# Patient Record
Sex: Male | Born: 1950 | Race: White | Hispanic: No | Smoking: Current every day smoker
Health system: Southern US, Community
[De-identification: ages and names within clinical notes are randomized; demographics above are authoritative.]

## PROBLEM LIST (undated history)

## (undated) DIAGNOSIS — D751 Secondary polycythemia: Principal | ICD-10-CM

## (undated) DIAGNOSIS — D45 Polycythemia vera: Secondary | ICD-10-CM

## (undated) DIAGNOSIS — IMO0002 Reserved for concepts with insufficient information to code with codable children: Secondary | ICD-10-CM

## (undated) DIAGNOSIS — I10 Essential (primary) hypertension: Secondary | ICD-10-CM

## (undated) HISTORY — PX: VASECTOMY: SHX75

## (undated) HISTORY — PX: TONSILLECTOMY: SHX5217

## (undated) HISTORY — DX: Polycythemia vera: D45

## (undated) HISTORY — PX: HERNIA REPAIR: SHX51

## (undated) HISTORY — DX: Secondary polycythemia: D75.1

## (undated) HISTORY — DX: Essential (primary) hypertension: I10

## (undated) HISTORY — DX: Reserved for concepts with insufficient information to code with codable children: IMO0002

---

## 2007-03-02 ENCOUNTER — Encounter (HOSPITAL_COMMUNITY): Admission: RE | Admit: 2007-03-02 | Discharge: 2007-05-20 | Payer: Self-pay

## 2008-01-04 ENCOUNTER — Encounter (HOSPITAL_COMMUNITY): Admission: RE | Admit: 2008-01-04 | Discharge: 2008-04-03 | Payer: Self-pay | Admitting: Internal Medicine

## 2008-11-23 ENCOUNTER — Encounter: Admission: RE | Admit: 2008-11-23 | Discharge: 2008-11-23 | Payer: Self-pay | Admitting: Family Medicine

## 2008-11-25 ENCOUNTER — Ambulatory Visit: Payer: Self-pay | Admitting: Internal Medicine

## 2008-11-30 ENCOUNTER — Ambulatory Visit: Admission: RE | Admit: 2008-11-30 | Discharge: 2008-11-30 | Payer: Self-pay | Admitting: Internal Medicine

## 2008-11-30 LAB — CBC WITH DIFFERENTIAL/PLATELET
BASO%: 0.3 % (ref 0.0–2.0)
Basophils Absolute: 0 10*3/uL (ref 0.0–0.1)
HCT: 57.8 % — ABNORMAL HIGH (ref 38.4–49.9)
HGB: 19 g/dL — ABNORMAL HIGH (ref 13.0–17.1)
MONO#: 0.5 10*3/uL (ref 0.1–0.9)
NEUT#: 6.6 10*3/uL — ABNORMAL HIGH (ref 1.5–6.5)
NEUT%: 71.2 % (ref 39.0–75.0)
WBC: 9.3 10*3/uL (ref 4.0–10.3)
lymph#: 1.9 10*3/uL (ref 0.9–3.3)

## 2008-12-01 LAB — FERRITIN: Ferritin: 6 ng/mL — ABNORMAL LOW (ref 22–322)

## 2008-12-01 LAB — IRON AND TIBC
%SAT: 13 % — ABNORMAL LOW (ref 20–55)
TIBC: 459 ug/dL — ABNORMAL HIGH (ref 215–435)

## 2008-12-01 LAB — COMPREHENSIVE METABOLIC PANEL
ALT: 20 U/L (ref 0–53)
Albumin: 4.1 g/dL (ref 3.5–5.2)
BUN: 14 mg/dL (ref 6–23)
CO2: 27 mEq/L (ref 19–32)
Calcium: 9.1 mg/dL (ref 8.4–10.5)
Chloride: 106 mEq/L (ref 96–112)
Creatinine, Ser: 0.98 mg/dL (ref 0.40–1.50)
Potassium: 4.7 mEq/L (ref 3.5–5.3)

## 2008-12-07 LAB — CBC WITH DIFFERENTIAL/PLATELET
Basophils Absolute: 0 10*3/uL (ref 0.0–0.1)
EOS%: 3.8 % (ref 0.0–7.0)
Eosinophils Absolute: 0.3 10*3/uL (ref 0.0–0.5)
HCT: 54.5 % — ABNORMAL HIGH (ref 38.4–49.9)
HGB: 18.1 g/dL — ABNORMAL HIGH (ref 13.0–17.1)
MONO#: 0.3 10*3/uL (ref 0.1–0.9)
NEUT#: 4 10*3/uL (ref 1.5–6.5)
NEUT%: 60.9 % (ref 39.0–75.0)
RDW: 17.3 % — ABNORMAL HIGH (ref 11.0–14.6)
WBC: 6.6 10*3/uL (ref 4.0–10.3)
lymph#: 2 10*3/uL (ref 0.9–3.3)

## 2008-12-14 LAB — CBC WITH DIFFERENTIAL/PLATELET
BASO%: 0.7 % (ref 0.0–2.0)
Basophils Absolute: 0.1 10*3/uL (ref 0.0–0.1)
EOS%: 2.8 % (ref 0.0–7.0)
HGB: 17.2 g/dL — ABNORMAL HIGH (ref 13.0–17.1)
MONO%: 5.3 % (ref 0.0–14.0)
NEUT#: 5.4 10*3/uL (ref 1.5–6.5)
NEUT%: 62.8 % (ref 39.0–75.0)
Platelets: 185 10*3/uL (ref 140–400)
RBC: 6.09 10*6/uL — ABNORMAL HIGH (ref 4.20–5.82)
WBC: 8.6 10*3/uL (ref 4.0–10.3)
lymph#: 2.4 10*3/uL (ref 0.9–3.3)

## 2008-12-28 LAB — CBC WITH DIFFERENTIAL/PLATELET
BASO%: 0.5 % (ref 0.0–2.0)
Basophils Absolute: 0 10*3/uL (ref 0.0–0.1)
EOS%: 1.6 % (ref 0.0–7.0)
HGB: 16.4 g/dL (ref 13.0–17.1)
MCH: 28.5 pg (ref 27.2–33.4)
RDW: 16 % — ABNORMAL HIGH (ref 11.0–14.6)
lymph#: 1.8 10*3/uL (ref 0.9–3.3)

## 2009-01-09 ENCOUNTER — Ambulatory Visit: Payer: Self-pay | Admitting: Internal Medicine

## 2009-01-11 LAB — LACTATE DEHYDROGENASE: LDH: 120 U/L (ref 94–250)

## 2009-01-11 LAB — CBC WITH DIFFERENTIAL/PLATELET
Eosinophils Absolute: 0.2 10*3/uL (ref 0.0–0.5)
MONO#: 0.5 10*3/uL (ref 0.1–0.9)
NEUT#: 5 10*3/uL (ref 1.5–6.5)
Platelets: 189 10*3/uL (ref 140–400)
RBC: 6.02 10*6/uL — ABNORMAL HIGH (ref 4.20–5.82)
RDW: 15.4 % — ABNORMAL HIGH (ref 11.0–14.6)
WBC: 7.9 10*3/uL (ref 4.0–10.3)
lymph#: 2.2 10*3/uL (ref 0.9–3.3)

## 2009-02-06 LAB — CBC WITH DIFFERENTIAL/PLATELET
Basophils Absolute: 0.1 10*3/uL (ref 0.0–0.1)
EOS%: 2.4 % (ref 0.0–7.0)
Eosinophils Absolute: 0.2 10*3/uL (ref 0.0–0.5)
HGB: 15.3 g/dL (ref 13.0–17.1)
MCH: 26.2 pg — ABNORMAL LOW (ref 27.2–33.4)
MONO#: 0.6 10*3/uL (ref 0.1–0.9)
NEUT#: 4.5 10*3/uL (ref 1.5–6.5)
RDW: 14.4 % (ref 11.0–14.6)
WBC: 7.2 10*3/uL (ref 4.0–10.3)
lymph#: 1.9 10*3/uL (ref 0.9–3.3)

## 2009-03-02 ENCOUNTER — Ambulatory Visit: Payer: Self-pay | Admitting: Internal Medicine

## 2009-03-06 LAB — CBC WITH DIFFERENTIAL/PLATELET
BASO%: 0.9 % (ref 0.0–2.0)
EOS%: 5.1 % (ref 0.0–7.0)
MCH: 25.7 pg — ABNORMAL LOW (ref 27.2–33.4)
MCHC: 32.1 g/dL (ref 32.0–36.0)
MONO#: 0.4 10*3/uL (ref 0.1–0.9)
RBC: 6.01 10*6/uL — ABNORMAL HIGH (ref 4.20–5.82)
RDW: 15.3 % — ABNORMAL HIGH (ref 11.0–14.6)
WBC: 6.3 10*3/uL (ref 4.0–10.3)
lymph#: 1.7 10*3/uL (ref 0.9–3.3)

## 2009-03-30 ENCOUNTER — Ambulatory Visit: Payer: Self-pay | Admitting: Internal Medicine

## 2009-04-03 LAB — CBC WITH DIFFERENTIAL/PLATELET
BASO%: 1.2 % (ref 0.0–2.0)
EOS%: 3.9 % (ref 0.0–7.0)
HCT: 48.4 % (ref 38.4–49.9)
LYMPH%: 28.7 % (ref 14.0–49.0)
MCH: 25.5 pg — ABNORMAL LOW (ref 27.2–33.4)
MCHC: 32.2 g/dL (ref 32.0–36.0)
NEUT%: 58.7 % (ref 39.0–75.0)
Platelets: 172 10*3/uL (ref 140–400)
RBC: 6.12 10*6/uL — ABNORMAL HIGH (ref 4.20–5.82)
WBC: 6.3 10*3/uL (ref 4.0–10.3)

## 2009-04-26 ENCOUNTER — Ambulatory Visit: Payer: Self-pay | Admitting: Internal Medicine

## 2009-05-01 LAB — CBC WITH DIFFERENTIAL/PLATELET
BASO%: 0.8 % (ref 0.0–2.0)
HCT: 50.4 % — ABNORMAL HIGH (ref 38.4–49.9)
LYMPH%: 30.9 % (ref 14.0–49.0)
MCHC: 32.7 g/dL (ref 32.0–36.0)
MCV: 77.7 fL — ABNORMAL LOW (ref 79.3–98.0)
MONO#: 0.5 10*3/uL (ref 0.1–0.9)
MONO%: 6.3 % (ref 0.0–14.0)
NEUT%: 59.1 % (ref 39.0–75.0)
Platelets: 169 10*3/uL (ref 140–400)
RBC: 6.48 10*6/uL — ABNORMAL HIGH (ref 4.20–5.82)
WBC: 7.2 10*3/uL (ref 4.0–10.3)

## 2009-05-30 ENCOUNTER — Ambulatory Visit: Payer: Self-pay | Admitting: Internal Medicine

## 2009-06-01 LAB — CBC WITH DIFFERENTIAL/PLATELET
Basophils Absolute: 0 10*3/uL (ref 0.0–0.1)
Eosinophils Absolute: 0.2 10*3/uL (ref 0.0–0.5)
HCT: 53.4 % — ABNORMAL HIGH (ref 38.4–49.9)
HGB: 17.1 g/dL (ref 13.0–17.1)
LYMPH%: 25.8 % (ref 14.0–49.0)
MCV: 80.1 fL (ref 79.3–98.0)
MONO#: 0.5 10*3/uL (ref 0.1–0.9)
MONO%: 7.4 % (ref 0.0–14.0)
NEUT#: 4.7 10*3/uL (ref 1.5–6.5)
NEUT%: 64 % (ref 39.0–75.0)
Platelets: 196 10*3/uL (ref 140–400)
RBC: 6.67 10*6/uL — ABNORMAL HIGH (ref 4.20–5.82)
WBC: 7.4 10*3/uL (ref 4.0–10.3)

## 2009-06-21 LAB — CBC WITH DIFFERENTIAL/PLATELET
Basophils Absolute: 0.1 10*3/uL (ref 0.0–0.1)
EOS%: 3.3 % (ref 0.0–7.0)
HGB: 16.4 g/dL (ref 13.0–17.1)
MCH: 26.1 pg — ABNORMAL LOW (ref 27.2–33.4)
MONO%: 5.3 % (ref 0.0–14.0)
NEUT#: 5.3 10*3/uL (ref 1.5–6.5)
RBC: 6.26 10*6/uL — ABNORMAL HIGH (ref 4.20–5.82)
RDW: 18.5 % — ABNORMAL HIGH (ref 11.0–14.6)
lymph#: 2 10*3/uL (ref 0.9–3.3)

## 2009-06-27 ENCOUNTER — Ambulatory Visit: Payer: Self-pay | Admitting: Internal Medicine

## 2009-07-05 LAB — CBC WITH DIFFERENTIAL/PLATELET
Basophils Absolute: 0.1 10*3/uL (ref 0.0–0.1)
Eosinophils Absolute: 0.4 10*3/uL (ref 0.0–0.5)
HGB: 16.8 g/dL (ref 13.0–17.1)
MCV: 80.2 fL (ref 79.3–98.0)
MONO#: 0.6 10*3/uL (ref 0.1–0.9)
MONO%: 7.7 % (ref 0.0–14.0)
NEUT#: 4.9 10*3/uL (ref 1.5–6.5)
RBC: 6.45 10*6/uL — ABNORMAL HIGH (ref 4.20–5.82)
RDW: 17.1 % — ABNORMAL HIGH (ref 11.0–14.6)
WBC: 8.1 10*3/uL (ref 4.0–10.3)
lymph#: 2.1 10*3/uL (ref 0.9–3.3)

## 2009-07-31 ENCOUNTER — Ambulatory Visit: Payer: Self-pay | Admitting: Internal Medicine

## 2009-08-07 LAB — CBC WITH DIFFERENTIAL/PLATELET
BASO%: 0.5 % (ref 0.0–2.0)
Basophils Absolute: 0.1 10*3/uL (ref 0.0–0.1)
EOS%: 4.1 % (ref 0.0–7.0)
Eosinophils Absolute: 0.4 10*3/uL (ref 0.0–0.5)
HCT: 52 % — ABNORMAL HIGH (ref 38.4–49.9)
HGB: 16.9 g/dL (ref 13.0–17.1)
LYMPH%: 23.7 % (ref 14.0–49.0)
MCH: 26 pg — ABNORMAL LOW (ref 27.2–33.4)
MCHC: 32.5 g/dL (ref 32.0–36.0)
MCV: 80 fL (ref 79.3–98.0)
MONO#: 0.5 10*3/uL (ref 0.1–0.9)
MONO%: 4.9 % (ref 0.0–14.0)
NEUT#: 6.3 10*3/uL (ref 1.5–6.5)
NEUT%: 66.8 % (ref 39.0–75.0)
Platelets: 183 10*3/uL (ref 140–400)
RBC: 6.5 10*6/uL — ABNORMAL HIGH (ref 4.20–5.82)
RDW: 15.8 % — ABNORMAL HIGH (ref 11.0–14.6)
WBC: 9.4 10*3/uL (ref 4.0–10.3)
lymph#: 2.2 10*3/uL (ref 0.9–3.3)

## 2009-08-31 ENCOUNTER — Ambulatory Visit: Payer: Self-pay | Admitting: Internal Medicine

## 2009-09-04 LAB — CBC WITH DIFFERENTIAL/PLATELET
BASO%: 0.6 % (ref 0.0–2.0)
Basophils Absolute: 0.1 10*3/uL (ref 0.0–0.1)
EOS%: 3.3 % (ref 0.0–7.0)
Eosinophils Absolute: 0.3 10*3/uL (ref 0.0–0.5)
HCT: 50.8 % — ABNORMAL HIGH (ref 38.4–49.9)
HGB: 16.6 g/dL (ref 13.0–17.1)
LYMPH%: 24.9 % (ref 14.0–49.0)
MCH: 25.5 pg — ABNORMAL LOW (ref 27.2–33.4)
MCHC: 32.6 g/dL (ref 32.0–36.0)
MCV: 78 fL — ABNORMAL LOW (ref 79.3–98.0)
MONO#: 0.5 10*3/uL (ref 0.1–0.9)
MONO%: 5.2 % (ref 0.0–14.0)
NEUT#: 6.3 10*3/uL (ref 1.5–6.5)
NEUT%: 66 % (ref 39.0–75.0)
Platelets: 191 10*3/uL (ref 140–400)
RBC: 6.52 10*6/uL — ABNORMAL HIGH (ref 4.20–5.82)
RDW: 15.5 % — ABNORMAL HIGH (ref 11.0–14.6)
WBC: 9.6 10*3/uL (ref 4.0–10.3)
lymph#: 2.4 10*3/uL (ref 0.9–3.3)

## 2009-09-25 ENCOUNTER — Ambulatory Visit: Payer: Self-pay | Admitting: Internal Medicine

## 2009-11-03 ENCOUNTER — Ambulatory Visit: Payer: Self-pay | Admitting: Internal Medicine

## 2009-11-06 LAB — CBC WITH DIFFERENTIAL/PLATELET
BASO%: 1.2 % (ref 0.0–2.0)
Basophils Absolute: 0.1 10*3/uL (ref 0.0–0.1)
EOS%: 5.2 % (ref 0.0–7.0)
Eosinophils Absolute: 0.3 10*3/uL (ref 0.0–0.5)
HCT: 46 % (ref 38.4–49.9)
HGB: 14.8 g/dL (ref 13.0–17.1)
LYMPH%: 29.8 % (ref 14.0–49.0)
MCH: 24.5 pg — ABNORMAL LOW (ref 27.2–33.4)
MCHC: 32.1 g/dL (ref 32.0–36.0)
MCV: 76.2 fL — ABNORMAL LOW (ref 79.3–98.0)
MONO#: 0.3 10*3/uL (ref 0.1–0.9)
MONO%: 5.1 % (ref 0.0–14.0)
NEUT#: 3.8 10*3/uL (ref 1.5–6.5)
NEUT%: 58.7 % (ref 39.0–75.0)
Platelets: 172 10*3/uL (ref 140–400)
RBC: 6.04 10*6/uL — ABNORMAL HIGH (ref 4.20–5.82)
RDW: 18.1 % — ABNORMAL HIGH (ref 11.0–14.6)
WBC: 6.5 10*3/uL (ref 4.0–10.3)
lymph#: 1.9 10*3/uL (ref 0.9–3.3)

## 2009-11-06 LAB — LACTATE DEHYDROGENASE: LDH: 115 U/L (ref 94–250)

## 2009-11-12 ENCOUNTER — Emergency Department (HOSPITAL_BASED_OUTPATIENT_CLINIC_OR_DEPARTMENT_OTHER): Admission: EM | Admit: 2009-11-12 | Discharge: 2009-11-12 | Payer: Self-pay | Admitting: Emergency Medicine

## 2010-07-23 ENCOUNTER — Ambulatory Visit: Payer: Self-pay | Admitting: Internal Medicine

## 2010-07-23 LAB — CBC WITH DIFFERENTIAL/PLATELET
BASO%: 0.6 % (ref 0.0–2.0)
Basophils Absolute: 0 10*3/uL (ref 0.0–0.1)
EOS%: 4.4 % (ref 0.0–7.0)
Eosinophils Absolute: 0.3 10*3/uL (ref 0.0–0.5)
HCT: 54.5 % — ABNORMAL HIGH (ref 38.4–49.9)
HGB: 17.4 g/dL — ABNORMAL HIGH (ref 13.0–17.1)
LYMPH%: 21.1 % (ref 14.0–49.0)
MCH: 23 pg — ABNORMAL LOW (ref 27.2–33.4)
MCHC: 31.9 g/dL — ABNORMAL LOW (ref 32.0–36.0)
MCV: 72.3 fL — ABNORMAL LOW (ref 79.3–98.0)
MONO#: 0.5 10*3/uL (ref 0.1–0.9)
MONO%: 7.8 % (ref 0.0–14.0)
NEUT#: 4.4 10*3/uL (ref 1.5–6.5)
NEUT%: 66.1 % (ref 39.0–75.0)
Platelets: 142 10*3/uL (ref 140–400)
RBC: 7.55 10*6/uL — ABNORMAL HIGH (ref 4.20–5.82)
RDW: 22 % — ABNORMAL HIGH (ref 11.0–14.6)
WBC: 6.7 10*3/uL (ref 4.0–10.3)
lymph#: 1.4 10*3/uL (ref 0.9–3.3)

## 2010-07-23 LAB — LACTATE DEHYDROGENASE: LDH: 129 U/L (ref 94–250)

## 2010-10-23 LAB — BLOOD GAS, ARTERIAL
Bicarbonate: 24.8 mEq/L — ABNORMAL HIGH (ref 20.0–24.0)
FIO2: 0.21 %
O2 Saturation: 97.3 %
Patient temperature: 98.6

## 2010-11-01 ENCOUNTER — Encounter (HOSPITAL_BASED_OUTPATIENT_CLINIC_OR_DEPARTMENT_OTHER): Payer: 59 | Admitting: Internal Medicine

## 2010-11-01 ENCOUNTER — Other Ambulatory Visit: Payer: Self-pay | Admitting: Internal Medicine

## 2010-11-01 DIAGNOSIS — D45 Polycythemia vera: Secondary | ICD-10-CM

## 2010-11-01 LAB — CBC WITH DIFFERENTIAL/PLATELET
BASO%: 0.5 % (ref 0.0–2.0)
EOS%: 2.7 % (ref 0.0–7.0)
HCT: 55 % — ABNORMAL HIGH (ref 38.4–49.9)
LYMPH%: 22.6 % (ref 14.0–49.0)
MCH: 28.4 pg (ref 27.2–33.4)
MCHC: 33.3 g/dL (ref 32.0–36.0)
NEUT%: 69 % (ref 39.0–75.0)
Platelets: 127 10*3/uL — ABNORMAL LOW (ref 140–400)
RBC: 6.45 10*6/uL — ABNORMAL HIGH (ref 4.20–5.82)
lymph#: 2 10*3/uL (ref 0.9–3.3)

## 2010-11-26 ENCOUNTER — Other Ambulatory Visit: Payer: Self-pay | Admitting: Internal Medicine

## 2010-11-26 ENCOUNTER — Encounter (HOSPITAL_BASED_OUTPATIENT_CLINIC_OR_DEPARTMENT_OTHER): Payer: 59 | Admitting: Internal Medicine

## 2010-11-26 DIAGNOSIS — D751 Secondary polycythemia: Secondary | ICD-10-CM

## 2010-11-26 DIAGNOSIS — D45 Polycythemia vera: Secondary | ICD-10-CM

## 2010-11-26 LAB — CBC WITH DIFFERENTIAL/PLATELET
BASO%: 0.5 % (ref 0.0–2.0)
EOS%: 2.7 % (ref 0.0–7.0)
MCH: 29.2 pg (ref 27.2–33.4)
MCV: 86.7 fL (ref 79.3–98.0)
MONO%: 5.9 % (ref 0.0–14.0)
RBC: 6.07 10*6/uL — ABNORMAL HIGH (ref 4.20–5.82)
RDW: 16.7 % — ABNORMAL HIGH (ref 11.0–14.6)

## 2010-11-26 LAB — LACTATE DEHYDROGENASE: LDH: 137 U/L (ref 94–250)

## 2011-01-14 ENCOUNTER — Emergency Department (HOSPITAL_BASED_OUTPATIENT_CLINIC_OR_DEPARTMENT_OTHER)
Admission: EM | Admit: 2011-01-14 | Discharge: 2011-01-14 | Discharge status: Against Medical Advice | Payer: 59 | Attending: Emergency Medicine | Admitting: Emergency Medicine

## 2011-01-14 ENCOUNTER — Emergency Department (INDEPENDENT_AMBULATORY_CARE_PROVIDER_SITE_OTHER): Payer: 59

## 2011-01-14 DIAGNOSIS — J3489 Other specified disorders of nose and nasal sinuses: Secondary | ICD-10-CM

## 2011-01-14 DIAGNOSIS — G8929 Other chronic pain: Secondary | ICD-10-CM | POA: Insufficient documentation

## 2011-01-14 DIAGNOSIS — R209 Unspecified disturbances of skin sensation: Secondary | ICD-10-CM

## 2011-01-14 DIAGNOSIS — J984 Other disorders of lung: Secondary | ICD-10-CM | POA: Insufficient documentation

## 2011-01-14 DIAGNOSIS — M79609 Pain in unspecified limb: Secondary | ICD-10-CM

## 2011-01-14 DIAGNOSIS — I10 Essential (primary) hypertension: Secondary | ICD-10-CM | POA: Insufficient documentation

## 2011-01-14 DIAGNOSIS — G459 Transient cerebral ischemic attack, unspecified: Secondary | ICD-10-CM | POA: Insufficient documentation

## 2011-01-14 LAB — HEPATIC FUNCTION PANEL
ALT: 24 U/L (ref 0–53)
AST: 20 U/L (ref 0–37)
Albumin: 3.7 g/dL (ref 3.5–5.2)
Alkaline Phosphatase: 56 U/L (ref 39–117)
Total Bilirubin: 0.3 mg/dL (ref 0.3–1.2)

## 2011-01-14 LAB — CK TOTAL AND CKMB (NOT AT ARMC)
CK, MB: 1.7 ng/mL (ref 0.3–4.0)
Total CK: 60 U/L (ref 7–232)

## 2011-01-14 LAB — CBC
MCH: 28.1 pg (ref 26.0–34.0)
Platelets: 164 10*3/uL (ref 150–400)
RBC: 6.44 MIL/uL — ABNORMAL HIGH (ref 4.22–5.81)
WBC: 7.8 10*3/uL (ref 4.0–10.5)

## 2011-01-14 LAB — DIFFERENTIAL
Basophils Relative: 1 % (ref 0–1)
Eosinophils Absolute: 0.2 10*3/uL (ref 0.0–0.7)
Monocytes Relative: 6 % (ref 3–12)
Neutrophils Relative %: 67 % (ref 43–77)

## 2011-01-14 LAB — BASIC METABOLIC PANEL
CO2: 24 mEq/L (ref 19–32)
Calcium: 9.6 mg/dL (ref 8.4–10.5)
Sodium: 141 mEq/L (ref 135–145)

## 2011-01-14 LAB — PROTIME-INR: INR: 1.18 (ref 0.00–1.49)

## 2011-01-18 ENCOUNTER — Other Ambulatory Visit: Payer: Self-pay | Admitting: Internal Medicine

## 2011-01-18 ENCOUNTER — Encounter (HOSPITAL_BASED_OUTPATIENT_CLINIC_OR_DEPARTMENT_OTHER): Payer: 59 | Admitting: Internal Medicine

## 2011-01-18 DIAGNOSIS — D45 Polycythemia vera: Secondary | ICD-10-CM

## 2011-01-18 LAB — CBC WITH DIFFERENTIAL/PLATELET
BASO%: 0.2 % (ref 0.0–2.0)
EOS%: 1.8 % (ref 0.0–7.0)
LYMPH%: 19.1 % (ref 14.0–49.0)
MCH: 28.4 pg (ref 27.2–33.4)
MCHC: 32.9 g/dL (ref 32.0–36.0)
MCV: 86.2 fL (ref 79.3–98.0)
MONO%: 5.8 % (ref 0.0–14.0)
Platelets: 157 10*3/uL (ref 140–400)
RBC: 6.23 10*6/uL — ABNORMAL HIGH (ref 4.20–5.82)
WBC: 8.4 10*3/uL (ref 4.0–10.3)

## 2011-02-25 ENCOUNTER — Other Ambulatory Visit: Payer: Self-pay | Admitting: Internal Medicine

## 2011-02-25 ENCOUNTER — Encounter (HOSPITAL_BASED_OUTPATIENT_CLINIC_OR_DEPARTMENT_OTHER): Payer: 59 | Admitting: Internal Medicine

## 2011-02-25 DIAGNOSIS — J449 Chronic obstructive pulmonary disease, unspecified: Secondary | ICD-10-CM

## 2011-02-25 DIAGNOSIS — F17200 Nicotine dependence, unspecified, uncomplicated: Secondary | ICD-10-CM

## 2011-02-25 DIAGNOSIS — D751 Secondary polycythemia: Secondary | ICD-10-CM

## 2011-02-25 LAB — CBC WITH DIFFERENTIAL/PLATELET
BASO%: 0.9 % (ref 0.0–2.0)
EOS%: 3.4 % (ref 0.0–7.0)
LYMPH%: 29.4 % (ref 14.0–49.0)
MCH: 29 pg (ref 27.2–33.4)
MCHC: 33.7 g/dL (ref 32.0–36.0)
MONO#: 0.5 10*3/uL (ref 0.1–0.9)
RBC: 6.02 10*6/uL — ABNORMAL HIGH (ref 4.20–5.82)
WBC: 6.8 10*3/uL (ref 4.0–10.3)
lymph#: 2 10*3/uL (ref 0.9–3.3)

## 2011-02-25 LAB — LACTATE DEHYDROGENASE: LDH: 125 U/L (ref 94–250)

## 2011-03-25 ENCOUNTER — Other Ambulatory Visit: Payer: Self-pay | Admitting: Internal Medicine

## 2011-03-25 ENCOUNTER — Encounter (HOSPITAL_BASED_OUTPATIENT_CLINIC_OR_DEPARTMENT_OTHER): Payer: 59 | Admitting: Internal Medicine

## 2011-03-25 DIAGNOSIS — D45 Polycythemia vera: Secondary | ICD-10-CM

## 2011-03-25 DIAGNOSIS — D751 Secondary polycythemia: Secondary | ICD-10-CM

## 2011-03-25 LAB — CBC WITH DIFFERENTIAL/PLATELET
BASO%: 0.4 % (ref 0.0–2.0)
Eosinophils Absolute: 0.4 10*3/uL (ref 0.0–0.5)
HCT: 52.5 % — ABNORMAL HIGH (ref 38.4–49.9)
LYMPH%: 25.5 % (ref 14.0–49.0)
MCHC: 33.3 g/dL (ref 32.0–36.0)
MONO#: 0.5 10*3/uL (ref 0.1–0.9)
NEUT#: 4.6 10*3/uL (ref 1.5–6.5)
NEUT%: 61.6 % (ref 39.0–75.0)
Platelets: 167 10*3/uL (ref 140–400)
RBC: 6.14 10*6/uL — ABNORMAL HIGH (ref 4.20–5.82)
WBC: 7.5 10*3/uL (ref 4.0–10.3)
lymph#: 1.9 10*3/uL (ref 0.9–3.3)

## 2011-04-11 LAB — CBC
HCT: 47.6
Hemoglobin: 15.7
MCV: 73 — ABNORMAL LOW
Platelets: 213
WBC: 8.5

## 2011-04-12 LAB — CBC
HCT: 46.3
Hemoglobin: 14.9
MCHC: 32.3
Platelets: 209
RDW: 18.8 — ABNORMAL HIGH

## 2011-04-22 ENCOUNTER — Encounter (HOSPITAL_BASED_OUTPATIENT_CLINIC_OR_DEPARTMENT_OTHER): Payer: 59 | Admitting: Internal Medicine

## 2011-04-22 ENCOUNTER — Other Ambulatory Visit: Payer: Self-pay | Admitting: Internal Medicine

## 2011-04-22 DIAGNOSIS — D751 Secondary polycythemia: Secondary | ICD-10-CM

## 2011-04-22 LAB — CBC WITH DIFFERENTIAL/PLATELET
BASO%: 0.8 % (ref 0.0–2.0)
EOS%: 3.3 % (ref 0.0–7.0)
LYMPH%: 29.7 % (ref 14.0–49.0)
MCH: 28 pg (ref 27.2–33.4)
MCHC: 33.3 g/dL (ref 32.0–36.0)
MCV: 84.2 fL (ref 79.3–98.0)
MONO%: 8 % (ref 0.0–14.0)
Platelets: 147 10*3/uL (ref 140–400)
RBC: 6.24 10*6/uL — ABNORMAL HIGH (ref 4.20–5.82)
RDW: 15.9 % — ABNORMAL HIGH (ref 11.0–14.6)

## 2011-04-26 LAB — POCT I-STAT 4, (NA,K, GLUC, HGB,HCT)
HCT: 49
Operator id: 117231
Sodium: 140

## 2011-04-29 ENCOUNTER — Encounter: Payer: Self-pay | Admitting: Internal Medicine

## 2011-05-21 NOTE — Progress Notes (Signed)
This encounter was created in error - please disregard.

## 2011-05-24 ENCOUNTER — Other Ambulatory Visit: Payer: Self-pay | Admitting: Physician Assistant

## 2011-05-24 ENCOUNTER — Encounter: Payer: Self-pay | Admitting: Internal Medicine

## 2011-05-24 ENCOUNTER — Encounter: Payer: Self-pay | Admitting: Physician Assistant

## 2011-05-24 DIAGNOSIS — D751 Secondary polycythemia: Secondary | ICD-10-CM

## 2011-05-24 HISTORY — DX: Secondary polycythemia: D75.1

## 2011-05-27 ENCOUNTER — Other Ambulatory Visit (HOSPITAL_BASED_OUTPATIENT_CLINIC_OR_DEPARTMENT_OTHER): Payer: 59 | Admitting: Lab

## 2011-05-27 ENCOUNTER — Other Ambulatory Visit: Payer: Self-pay | Admitting: Internal Medicine

## 2011-05-27 ENCOUNTER — Ambulatory Visit (HOSPITAL_BASED_OUTPATIENT_CLINIC_OR_DEPARTMENT_OTHER): Payer: 59 | Admitting: Physician Assistant

## 2011-05-27 VITALS — BP 103/76 | HR 89 | Temp 97.0°F | Ht 74.5 in | Wt 245.0 lb

## 2011-05-27 DIAGNOSIS — F17200 Nicotine dependence, unspecified, uncomplicated: Secondary | ICD-10-CM

## 2011-05-27 DIAGNOSIS — D45 Polycythemia vera: Secondary | ICD-10-CM

## 2011-05-27 DIAGNOSIS — D751 Secondary polycythemia: Secondary | ICD-10-CM

## 2011-05-27 DIAGNOSIS — R0602 Shortness of breath: Secondary | ICD-10-CM

## 2011-05-27 LAB — CBC WITH DIFFERENTIAL/PLATELET
Eosinophils Absolute: 0.2 10*3/uL (ref 0.0–0.5)
LYMPH%: 22.5 % (ref 14.0–49.0)
MCH: 27.8 pg (ref 27.2–33.4)
MCV: 84.1 fL (ref 79.3–98.0)
MONO%: 4.9 % (ref 0.0–14.0)
NEUT#: 5.9 10*3/uL (ref 1.5–6.5)
Platelets: 164 10*3/uL (ref 140–400)
RBC: 6.58 10*6/uL — ABNORMAL HIGH (ref 4.20–5.82)

## 2011-05-27 LAB — COMPREHENSIVE METABOLIC PANEL
ALT: 20 U/L (ref 0–53)
AST: 19 U/L (ref 0–37)
Albumin: 4.2 g/dL (ref 3.5–5.2)
Alkaline Phosphatase: 57 U/L (ref 39–117)
Potassium: 4.2 mEq/L (ref 3.5–5.3)
Sodium: 141 mEq/L (ref 135–145)
Total Protein: 7 g/dL (ref 6.0–8.3)

## 2011-05-27 NOTE — Progress Notes (Signed)
No images are attached to the encounter. No scans are attached to the encounter. No scans are attached to the encounter. [facility] OFFICE PROGRESS NOTE  CC: Tamika J. Lazarus Salines, M.D., Lacretia Leigh. Hooper M.D.  DIAGNOSIS: Reactive polycythemia   CURRENT THERAPY: Phlebotomy on an as-needed basis.  INTERVAL HISTORY: Jesse Anderson 60 y.o. male returns for 3 month regular off visit for followup of his reactive polycythemia. Overall he has been doing relatively well since last being seen. He does note some shortness of breath. He continues to smoke 2 packs of cigarettes daily. He states the last time that he was successful in quitting he had been hypnotized. He plans to look up the contact information for the person who hypnotized  He has him so that he can try this therapy again for smoking cessation.  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 He states that his 56 year-old daughter who is a Holiday representative in college needs him to be around that he would like to be available. 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  MEDICAL HISTORY: Past Medical History  Diagnosis Date  . Hypertension   . Polycythemia vera   . Degenerative disk disease   . Hemorrhoids   . Polycythemia 05/24/2011    ALLERGIES:   has no known allergies.  MEDICATIONS:  Current Outpatient Prescriptions  Medication Sig Dispense Refill  . aspirin 325 MG tablet Take 325 mg by mouth daily.        Marland Kitchen HYDROcodone-acetaminophen (VICODIN) 5-500 MG per tablet Take 1 tablet by mouth every 6 (six) hours as needed.        Marland Kitchen losartan (COZAAR) 50 MG tablet Take 50 mg by mouth daily.        . Tadalafil (CIALIS PO) Take by mouth.          SURGICAL HISTORY:  Past Surgical History  Procedure Date  . Tonsillectomy   . Vasectomy   . Hernia repair     REVIEW OF SYSTEMS:  Pertinent items are noted in  HPI. remainder of the review of systems is negative  PHYSICAL EXAMINATION: General appearance: alert, cooperative and no distress Head: Normocephalic, without obvious abnormality, atraumatic Neck: no adenopathy, no carotid bruit, no JVD, supple, symmetrical, trachea midline and thyroid not enlarged, symmetric, no tenderness/mass/nodules Lymph nodes: Cervical, supraclavicular, and axillary nodes normal. Resp: clear to auscultation bilaterally Cardio: regular rate and rhythm, S1, S2 normal, no murmur, click, rub or gallop GI: soft, non-tender; bowel sounds normal; no masses,  no organomegaly Extremities: extremities normal, atraumatic, no cyanosis or edema   Blood pressure 103/76, pulse 89, temperature 97 F (36.1 C), temperature source Oral, height 6' 2.5" (1.892 m), weight 245 lb (111.131 kg).  LABORATORY DATA: Lab Results  Component Value Date   WBC 7.8 01/14/2011   HGB 18.3* 05/27/2011   HCT 55.3* 05/27/2011   MCV 84.1 05/27/2011   PLT 164 05/27/2011      Chemistry      Component Value Date/Time   NA 141 05/27/2011 0940   K 4.2 05/27/2011 0940   CL 104 05/27/2011 0940   CO2 24 05/27/2011 0940   BUN 16 05/27/2011 0940   CREATININE 1.03 05/27/2011 0940      Component Value Date/Time   CALCIUM 9.5 05/27/2011 0940   ALKPHOS 57 05/27/2011 0940  AST 19 05/27/2011 0940   ALT 20 05/27/2011 0940   BILITOT 0.4 05/27/2011 0940       RADIOGRAPHIC STUDIES:  No results found.  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 The patient was discussed with Dr. Arbutus Ped. His hematocrit is in a range that will require 1 unit of  phlebotomy. This has been scheduled for Friday of this week at 4:00 to accommodate Mr. Brooks schedule. He will continue to have monthly lab checks consisting of a CBC differential as well as a phlebotomy appointment in the event it is necessary. He'll followup with Dr. Gwenyth Bouillon in 3 months with  a repeat CBC differential C. met and LDH and a phlebotomy appointment again in the event that it is necessary. He is encouraged to proceed with his smoking cessation efforts.    Conni Slipper, PA-C     All questions were answered. The patient knows to call the clinic with any problems, questions or concerns. We can certainly see the patient much sooner if necessary.

## 2011-05-27 NOTE — Progress Notes (Signed)
Quick Note:  Call patient with the result. He needs Phlebotomy ______

## 2011-05-30 ENCOUNTER — Telehealth: Payer: Self-pay | Admitting: Internal Medicine

## 2011-05-30 NOTE — Telephone Encounter (Signed)
lmonvm advising the pt of his lab appt on 06/24/2011 and for him to pick up an appt calendar when he comes in that day for his jan,feb 2013 appts

## 2011-05-31 ENCOUNTER — Ambulatory Visit (HOSPITAL_BASED_OUTPATIENT_CLINIC_OR_DEPARTMENT_OTHER): Payer: 59

## 2011-05-31 ENCOUNTER — Other Ambulatory Visit: Payer: Self-pay | Admitting: Internal Medicine

## 2011-05-31 DIAGNOSIS — D751 Secondary polycythemia: Secondary | ICD-10-CM

## 2011-05-31 NOTE — Patient Instructions (Signed)
1632- Pt refuses to stay after phlebotomy.  Pt states that he has bee doing this for 10 years and has never had a problem.  VSS.  Pt left ambulatory with next appointment confirmed.  Pt aware to call with any questions or concerns.

## 2011-05-31 NOTE — Progress Notes (Signed)
29562- Phlebotomy completed via right a/c with 499cc blood removed without difficulty.  Pressure applied with gauze and wrapped with coban.  Pt tolerated well.

## 2011-06-24 ENCOUNTER — Other Ambulatory Visit: Payer: 59

## 2011-06-28 ENCOUNTER — Encounter: Payer: Self-pay | Admitting: Hematology and Oncology

## 2011-06-28 ENCOUNTER — Ambulatory Visit (HOSPITAL_BASED_OUTPATIENT_CLINIC_OR_DEPARTMENT_OTHER): Payer: 59

## 2011-06-28 ENCOUNTER — Other Ambulatory Visit: Payer: Self-pay | Admitting: *Deleted

## 2011-06-28 ENCOUNTER — Other Ambulatory Visit (HOSPITAL_BASED_OUTPATIENT_CLINIC_OR_DEPARTMENT_OTHER): Payer: 59

## 2011-06-28 ENCOUNTER — Other Ambulatory Visit: Payer: Self-pay | Admitting: Internal Medicine

## 2011-06-28 DIAGNOSIS — D751 Secondary polycythemia: Secondary | ICD-10-CM

## 2011-06-28 DIAGNOSIS — D45 Polycythemia vera: Secondary | ICD-10-CM

## 2011-06-28 LAB — COMPREHENSIVE METABOLIC PANEL
AST: 20 U/L (ref 0–37)
Albumin: 3.8 g/dL (ref 3.5–5.2)
Alkaline Phosphatase: 68 U/L (ref 39–117)
Glucose, Bld: 91 mg/dL (ref 70–99)
Potassium: 4.3 mEq/L (ref 3.5–5.3)
Sodium: 139 mEq/L (ref 135–145)
Total Protein: 7.6 g/dL (ref 6.0–8.3)

## 2011-06-28 LAB — CBC WITH DIFFERENTIAL/PLATELET
EOS%: 3.5 % (ref 0.0–7.0)
Eosinophils Absolute: 0.2 10*3/uL (ref 0.0–0.5)
MCV: 83.1 fL (ref 79.3–98.0)
MONO%: 5.1 % (ref 0.0–14.0)
NEUT#: 3.6 10*3/uL (ref 1.5–6.5)
RBC: 6.39 10*6/uL — ABNORMAL HIGH (ref 4.20–5.82)
RDW: 16.2 % — ABNORMAL HIGH (ref 11.0–14.6)

## 2011-07-29 ENCOUNTER — Other Ambulatory Visit (HOSPITAL_BASED_OUTPATIENT_CLINIC_OR_DEPARTMENT_OTHER): Payer: 59 | Admitting: Lab

## 2011-07-29 ENCOUNTER — Other Ambulatory Visit: Payer: Self-pay | Admitting: *Deleted

## 2011-07-29 DIAGNOSIS — D751 Secondary polycythemia: Secondary | ICD-10-CM

## 2011-07-29 DIAGNOSIS — D45 Polycythemia vera: Secondary | ICD-10-CM

## 2011-07-29 LAB — COMPREHENSIVE METABOLIC PANEL
Alkaline Phosphatase: 57 U/L (ref 39–117)
BUN: 17 mg/dL (ref 6–23)
Creatinine, Ser: 0.89 mg/dL (ref 0.50–1.35)
Glucose, Bld: 119 mg/dL — ABNORMAL HIGH (ref 70–99)
Total Bilirubin: 0.4 mg/dL (ref 0.3–1.2)

## 2011-07-29 LAB — CBC WITH DIFFERENTIAL/PLATELET
Basophils Absolute: 0 10*3/uL (ref 0.0–0.1)
Eosinophils Absolute: 0.2 10*3/uL (ref 0.0–0.5)
HGB: 17.4 g/dL — ABNORMAL HIGH (ref 13.0–17.1)
LYMPH%: 29.8 % (ref 14.0–49.0)
MCV: 82 fL (ref 79.3–98.0)
MONO%: 6.3 % (ref 0.0–14.0)
NEUT#: 3.9 10*3/uL (ref 1.5–6.5)
Platelets: 157 10*3/uL (ref 140–400)

## 2011-07-30 ENCOUNTER — Telehealth: Payer: Self-pay | Admitting: Internal Medicine

## 2011-07-30 NOTE — Telephone Encounter (Signed)
pt aware of 1/18 phlebot appt and will p/u copy of sch for 2/12   aom

## 2011-08-02 ENCOUNTER — Ambulatory Visit (HOSPITAL_BASED_OUTPATIENT_CLINIC_OR_DEPARTMENT_OTHER): Payer: 59

## 2011-08-02 DIAGNOSIS — D45 Polycythemia vera: Secondary | ICD-10-CM

## 2011-08-02 NOTE — Patient Instructions (Signed)
Patient received therapeutic phlebotomy with 500 mL return of blood.  Patient ambulatory out of clinic without complaints.  Instructed patient to call with any issues.  Patient aware of next appointment

## 2011-08-21 ENCOUNTER — Other Ambulatory Visit: Payer: Self-pay | Admitting: *Deleted

## 2011-08-21 ENCOUNTER — Telehealth: Payer: Self-pay | Admitting: Internal Medicine

## 2011-08-21 NOTE — Telephone Encounter (Signed)
called pt with 2/12 appt and that he will see adrena instead of mkm,aware         aom

## 2011-08-26 ENCOUNTER — Telehealth: Payer: Self-pay | Admitting: Internal Medicine

## 2011-08-26 NOTE — Telephone Encounter (Signed)
pt called to r/s 2/12 appt to 2/18,pt called with new appt aom

## 2011-08-27 ENCOUNTER — Ambulatory Visit: Payer: 59 | Admitting: Physician Assistant

## 2011-08-27 ENCOUNTER — Other Ambulatory Visit: Payer: 59

## 2011-08-27 ENCOUNTER — Ambulatory Visit: Payer: 59 | Admitting: Internal Medicine

## 2011-08-29 ENCOUNTER — Other Ambulatory Visit: Payer: Self-pay | Admitting: *Deleted

## 2011-09-02 ENCOUNTER — Other Ambulatory Visit: Payer: 59 | Admitting: Lab

## 2011-09-02 ENCOUNTER — Encounter: Payer: Self-pay | Admitting: Physician Assistant

## 2011-09-02 ENCOUNTER — Ambulatory Visit (HOSPITAL_BASED_OUTPATIENT_CLINIC_OR_DEPARTMENT_OTHER): Payer: 59 | Admitting: Physician Assistant

## 2011-09-02 ENCOUNTER — Ambulatory Visit: Payer: 59

## 2011-09-02 ENCOUNTER — Telehealth: Payer: Self-pay | Admitting: Internal Medicine

## 2011-09-02 VITALS — BP 121/76 | HR 67 | Temp 97.8°F | Ht 74.5 in | Wt 254.0 lb

## 2011-09-02 DIAGNOSIS — D45 Polycythemia vera: Secondary | ICD-10-CM

## 2011-09-02 DIAGNOSIS — D751 Secondary polycythemia: Secondary | ICD-10-CM

## 2011-09-02 LAB — CBC WITH DIFFERENTIAL/PLATELET
Eosinophils Absolute: 0.2 10*3/uL (ref 0.0–0.5)
HCT: 51.6 % — ABNORMAL HIGH (ref 38.4–49.9)
HGB: 17 g/dL (ref 13.0–17.1)
LYMPH%: 29 % (ref 14.0–49.0)
MONO#: 0.5 10*3/uL (ref 0.1–0.9)
NEUT#: 4.4 10*3/uL (ref 1.5–6.5)
NEUT%: 60.8 % (ref 39.0–75.0)
Platelets: 186 10*3/uL (ref 140–400)
RBC: 6.33 10*6/uL — ABNORMAL HIGH (ref 4.20–5.82)
WBC: 7.2 10*3/uL (ref 4.0–10.3)

## 2011-09-02 LAB — COMPREHENSIVE METABOLIC PANEL
BUN: 12 mg/dL (ref 6–23)
CO2: 22 mEq/L (ref 19–32)
Creatinine, Ser: 0.95 mg/dL (ref 0.50–1.35)
Glucose, Bld: 83 mg/dL (ref 70–99)
Total Bilirubin: 0.2 mg/dL — ABNORMAL LOW (ref 0.3–1.2)

## 2011-09-02 LAB — LACTATE DEHYDROGENASE: LDH: 127 U/L (ref 94–250)

## 2011-09-02 NOTE — Progress Notes (Signed)
Patient received therapeutic phlebotomy without complications-removed 540 cc.  Patient refused to stay post phlebotomy.  Denies dizziness, light headedness.  Vital signs stable.

## 2011-09-02 NOTE — Telephone Encounter (Signed)
appts made and printed for 3/4/5 2013   aom

## 2011-09-03 NOTE — Progress Notes (Signed)
No images are attached to the encounter. No scans are attached to the encounter. No scans are attached to the encounter.    [facility]ESS [facility]  CC: Tamika J. Lazarus Salines, M.D., Lacretia Leigh. Hooper M.D.  DIAGNOSIS: Reactive polycythemia   CURRENT THERAPY: Phlebotomy on an as-needed basis.  INTERVAL HISTORY: TRUE GARCIAMARTINEZ 61 y.o. male returns for 3 month regular off visit for followup of his reactive polycythemia. Overall he has been doing relatively well since last being seen. He continues to smoke 2 packs of cigarettes daily. He complains of being tired all the time. He continues to have back pain that is followed by Dr. Quintella Reichert. He states that through Dr. Jearl Klinefelter office he had an abnormal ECG and is awaiting a referral to a cardiologist. He denied frank chest pain.   MEDICAL HISTORY: Past Medical History  Diagnosis Date  . Hypertension   . Polycythemia vera   . Degenerative disk disease   . Hemorrhoids   . Polycythemia 05/24/2011    ALLERGIES:   has no known allergies.  MEDICATIONS:  Current Outpatient Prescriptions  Medication Sig Dispense Refill  . aspirin 325 MG tablet Take 325 mg by mouth daily.        . carisoprodol (SOMA) 350 MG tablet       . HYDROcodone-acetaminophen (VICODIN) 5-500 MG per tablet Take 1 tablet by mouth every 6 (six) hours as needed.        Marland Kitchen losartan (COZAAR) 50 MG tablet Take 50 mg by mouth daily.        . Tadalafil (CIALIS PO) Take by mouth.          SURGICAL HISTORY:  Past Surgical History  Procedure Date  . Tonsillectomy   . Vasectomy   . Hernia repair     REVIEW OF SYSTEMS:  Pertinent items are noted in HPI. remainder of the review of systems is negative  PHYSICAL EXAMINATION: General appearance: alert, cooperative and no distress Head: Normocephalic, without obvious abnormality, atraumatic Neck: no adenopathy, no carotid bruit, no JVD, supple, symmetrical, trachea midline and thyroid not enlarged, symmetric, no  tenderness/mass/nodules Lymph nodes: Cervical, supraclavicular, and axillary nodes normal. Resp: clear to auscultation bilaterally Cardio: regular rate and rhythm, S1, S2 normal, no murmur, click, rub or gallop GI: soft, non-tender; bowel sounds normal; no masses,  no organomegaly Extremities: extremities normal, atraumatic, no cyanosis or edema   Blood pressure 121/76, pulse 67, temperature 97.8 F (36.6 C), temperature source Oral, height 6' 2.5" (1.892 m), weight 115.214 kg (254 lb).  LABORATORY DATA: Lab Results  Component Value Date   WBC 7.2 09/02/2011   HGB 17.0 09/02/2011   HCT 51.6* 09/02/2011   MCV 81.6 09/02/2011   PLT 186 09/02/2011      Chemistry      Component Value Date/Time   NA 142 09/02/2011 0932   K 4.4 09/02/2011 0932   CL 108 09/02/2011 0932   CO2 22 09/02/2011 0932   BUN 12 09/02/2011 0932   CREATININE 0.95 09/02/2011 0932      Component Value Date/Time   CALCIUM 8.9 09/02/2011 0932   ALKPHOS 62 09/02/2011 0932   AST 23 09/02/2011 0932   ALT 29 09/02/2011 0932   BILITOT 0.2* 09/02/2011 0932       RADIOGRAPHIC STUDIES:  No results found.  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 The patient was discussed with Dr. Arbutus Ped. His hematocrit is in a range that  will require 1 unit of  Phlebotomy today.  He will continue to have monthly lab checks consisting of a CBC differential as well as a phlebotomy appointment in the event it is necessary. He'll followup with Dr. Gwenyth Bouillon in 3 months with a repeat CBC differential C. met and LDH and a phlebotomy appointment again in the event that it is necessary. He is encouraged to proceed with his smoking cessation efforts and to follow up with a cardiologist as set up by Dr. Jearl Klinefelter office.Marland Kitchen    Laural Benes, Anzleigh Slaven E, PA-C     All questions were answered. The patient knows to call the clinic with any problems, questions or concerns. We can certainly see  the patient much sooner if necessary.

## 2011-09-30 ENCOUNTER — Other Ambulatory Visit (HOSPITAL_BASED_OUTPATIENT_CLINIC_OR_DEPARTMENT_OTHER): Payer: 59 | Admitting: Lab

## 2011-09-30 ENCOUNTER — Ambulatory Visit (HOSPITAL_BASED_OUTPATIENT_CLINIC_OR_DEPARTMENT_OTHER): Payer: 59

## 2011-09-30 DIAGNOSIS — D751 Secondary polycythemia: Secondary | ICD-10-CM

## 2011-09-30 DIAGNOSIS — D45 Polycythemia vera: Secondary | ICD-10-CM

## 2011-09-30 LAB — CBC WITH DIFFERENTIAL/PLATELET
Basophils Absolute: 0.1 10*3/uL (ref 0.0–0.1)
EOS%: 3.3 % (ref 0.0–7.0)
HCT: 51.9 % — ABNORMAL HIGH (ref 38.4–49.9)
HGB: 16.8 g/dL (ref 13.0–17.1)
MCH: 26.5 pg — ABNORMAL LOW (ref 27.2–33.4)
NEUT%: 59.7 % (ref 39.0–75.0)
lymph#: 2.2 10*3/uL (ref 0.9–3.3)

## 2011-09-30 NOTE — Progress Notes (Signed)
Patient received therapeutic phlebotomy, removed 540 mL.  Patient refused to remain post phlebotomy.  Vital signs stable, patient without complaints.

## 2011-10-28 ENCOUNTER — Other Ambulatory Visit: Payer: 59 | Admitting: Lab

## 2011-10-28 ENCOUNTER — Ambulatory Visit (HOSPITAL_BASED_OUTPATIENT_CLINIC_OR_DEPARTMENT_OTHER): Payer: 59

## 2011-10-28 DIAGNOSIS — D751 Secondary polycythemia: Secondary | ICD-10-CM

## 2011-10-28 DIAGNOSIS — D45 Polycythemia vera: Secondary | ICD-10-CM

## 2011-10-28 LAB — CBC WITH DIFFERENTIAL/PLATELET
BASO%: 1.2 % (ref 0.0–2.0)
HCT: 49.8 % (ref 38.4–49.9)
MCHC: 32.6 g/dL (ref 32.0–36.0)
MONO#: 0.5 10*3/uL (ref 0.1–0.9)
RBC: 6.19 10*6/uL — ABNORMAL HIGH (ref 4.20–5.82)
WBC: 7.2 10*3/uL (ref 4.0–10.3)
lymph#: 2.4 10*3/uL (ref 0.9–3.3)
nRBC: 0 % (ref 0–0)

## 2011-10-28 NOTE — Progress Notes (Signed)
[id number]: Therapeutic phlebotomy completed. obtained from right antecubital. Pt tolerated well. Pt declined staying 30 minutes post phlebotomy, states he is "fine".

## 2011-12-02 ENCOUNTER — Other Ambulatory Visit: Payer: 59 | Admitting: Lab

## 2011-12-02 ENCOUNTER — Ambulatory Visit: Payer: 59 | Admitting: Internal Medicine

## 2012-02-26 ENCOUNTER — Other Ambulatory Visit: Payer: Self-pay | Admitting: Physician Assistant

## 2012-02-26 DIAGNOSIS — D751 Secondary polycythemia: Secondary | ICD-10-CM

## 2012-02-27 ENCOUNTER — Other Ambulatory Visit (HOSPITAL_BASED_OUTPATIENT_CLINIC_OR_DEPARTMENT_OTHER): Payer: 59 | Admitting: Lab

## 2012-02-27 ENCOUNTER — Telehealth: Payer: Self-pay | Admitting: Internal Medicine

## 2012-02-27 ENCOUNTER — Encounter: Payer: Self-pay | Admitting: Physician Assistant

## 2012-02-27 ENCOUNTER — Ambulatory Visit (HOSPITAL_BASED_OUTPATIENT_CLINIC_OR_DEPARTMENT_OTHER): Payer: 59 | Admitting: Physician Assistant

## 2012-02-27 ENCOUNTER — Ambulatory Visit (HOSPITAL_BASED_OUTPATIENT_CLINIC_OR_DEPARTMENT_OTHER): Payer: 59

## 2012-02-27 VITALS — BP 124/79 | HR 72 | Temp 97.6°F | Resp 20 | Ht 74.5 in | Wt 255.2 lb

## 2012-02-27 DIAGNOSIS — D45 Polycythemia vera: Secondary | ICD-10-CM

## 2012-02-27 DIAGNOSIS — F17200 Nicotine dependence, unspecified, uncomplicated: Secondary | ICD-10-CM

## 2012-02-27 DIAGNOSIS — D751 Secondary polycythemia: Secondary | ICD-10-CM

## 2012-02-27 LAB — COMPREHENSIVE METABOLIC PANEL
ALT: 30 U/L (ref 0–53)
Alkaline Phosphatase: 62 U/L (ref 39–117)
BUN: 15 mg/dL (ref 6–23)
Chloride: 106 mEq/L (ref 96–112)
Glucose, Bld: 114 mg/dL — ABNORMAL HIGH (ref 70–99)
Potassium: 4.3 mEq/L (ref 3.5–5.3)
Total Bilirubin: 0.4 mg/dL (ref 0.3–1.2)

## 2012-02-27 LAB — LACTATE DEHYDROGENASE: LDH: 124 U/L (ref 94–250)

## 2012-02-27 LAB — CBC WITH DIFFERENTIAL/PLATELET
Basophils Absolute: 0.1 10*3/uL (ref 0.0–0.1)
Eosinophils Absolute: 0.2 10*3/uL (ref 0.0–0.5)
HGB: 17.6 g/dL — ABNORMAL HIGH (ref 13.0–17.1)
NEUT#: 4.3 10*3/uL (ref 1.5–6.5)
RDW: 19.8 % — ABNORMAL HIGH (ref 11.0–14.6)
WBC: 7.3 10*3/uL (ref 4.0–10.3)
lymph#: 2.2 10*3/uL (ref 0.9–3.3)

## 2012-02-27 NOTE — Progress Notes (Signed)
No images are attached to the encounter. No scans are attached to the encounter. No scans are attached to the encounter.   [facility] OFFICE PROGRESS NOTE  CC: Jesse Anderson, M.D., Jesse Leigh. Anderson M.D.  DIAGNOSIS: Reactive polycythemia   CURRENT THERAPY: Phlebotomy on an as-needed basis in order to keep the hematocrit around 45%  INTERVAL HISTORY: Jesse Anderson 61 y.o. male returns for a  followup of his reactive polycythemia. Overall he has been doing relatively well since last being seen. He continues to smoke 2 packs of cigarettes daily. Today he voices no specific complaints. He does mention however that while he was working in [place] he was working 10 hour days and had some ankle swelling on 2-3 those days. It resolved after he put his feet up in the evening. He denied any headaches, chest pain, or decreased mentation.   MEDICAL HISTORY: Past Medical History  Diagnosis Date  . Hypertension   . Polycythemia vera   . Degenerative disk disease   . Hemorrhoids   . Polycythemia 05/24/2011    ALLERGIES:   has no known allergies.  MEDICATIONS:  Current Outpatient Prescriptions  Medication Sig Dispense Refill  . aspirin 325 MG tablet Take 325 mg by mouth daily.        . carisoprodol (SOMA) 350 MG tablet       . HYDROcodone-acetaminophen (VICODIN) 5-500 MG per tablet Take 1 tablet by mouth every 6 (six) hours as needed.        Marland Kitchen losartan (COZAAR) 50 MG tablet Take 50 mg by mouth daily.        . Tadalafil (CIALIS PO) Take by mouth.          SURGICAL HISTORY:  Past Surgical History  Procedure Date  . Tonsillectomy   . Vasectomy   . Hernia repair     REVIEW OF SYSTEMS:  Pertinent items are noted in HPI. remainder of the review of systems is negative  PHYSICAL EXAMINATION: General appearance: alert, cooperative and no distress Head: Normocephalic, without obvious abnormality, atraumatic Neck: no adenopathy, no carotid bruit, no JVD, supple, symmetrical,  trachea midline and thyroid not enlarged, symmetric, no tenderness/mass/nodules Lymph nodes: Cervical, supraclavicular, and axillary nodes normal. Resp: clear to auscultation bilaterally Cardio: regular rate and rhythm, S1, S2 normal, no murmur, click, rub or gallop GI: soft, non-tender; bowel sounds normal; no masses,  no organomegaly Extremities: extremities normal, atraumatic, no cyanosis or edema   Blood pressure 124/79, pulse 72, temperature 97.6 F (36.4 C), temperature source Oral, resp. rate 20, height 6' 2.5" (1.892 m), weight 255 lb 3.2 oz (115.758 kg).  LABORATORY DATA: Lab Results  Component Value Date   WBC 7.3 02/27/2012   HGB 17.6* 02/27/2012   HCT 54.0* 02/27/2012   MCV 78.9* 02/27/2012   PLT 163 02/27/2012      Chemistry      Component Value Date/Time   NA 142 09/02/2011 0932   K 4.4 09/02/2011 0932   CL 108 09/02/2011 0932   CO2 22 09/02/2011 0932   BUN 12 09/02/2011 0932   CREATININE 0.95 09/02/2011 0932      Component Value Date/Time   CALCIUM 8.9 09/02/2011 0932   ALKPHOS 62 09/02/2011 0932   AST 23 09/02/2011 0932   ALT 29 09/02/2011 0932   BILITOT 0.2* 09/02/2011 0932       RADIOGRAPHIC STUDIES:  No results found.  ASSESSMENT/PLAN: This is a  very pleasant 61 year old white male with reactive polycythemia secondary  to smoking. The patient was discussed with Dr. Arbutus Ped. His hematocrit is 54% today and he will require phlebotomy of one unit of blood today. We will set him up for monthly CBC differential and phlebotomy appointments again with the goal of keeping his hematocrit around 45%. Jesse Anderson knows that if he has to adjust the appointments based on his work schedule to requested both the lab and a phlebotomy appointments be moved together. We'll see him in followup in 3 months with repeat CBC differential C. met and LDH, as well as a phlebotomy appointment in the event that it is needed. He is encouraged to proceed with his smoking cessation  efforts.    Laural Anderson, Jesse Sturdy E, PA-C     All questions were answered. The patient knows to call the clinic with any problems, questions or concerns. We can certainly see the patient much sooner if necessary.

## 2012-02-27 NOTE — Telephone Encounter (Signed)
appts made and printed for pt aom °

## 2012-02-27 NOTE — Patient Instructions (Signed)
Therapeutic Phlebotomy Therapeutic phlebotomy is the controlled removal of blood from your body for the purpose of treating a medical condition. It is similar to donating blood. Usually, about a pint (470 mL) of blood is removed. The average adult has 9 to 12 pints (4.3 to 5.7 L) of blood. Therapeutic phlebotomy may be used to treat the following medical conditions:  Hemochromatosis. This is a condition in which there is too much iron in the blood.   Polycythemia vera. This is a condition in which there are too many red cells in the blood.   Porphyria cutanea tarda. This is a disease usually passed from one generation to the next (inherited). It is a condition in which an important part of hemoglobin is not made properly. This results in the build up of abnormal amounts of porphyrins in the body.   Sickle cell disease. This is an inherited disease. It is a condition in which the red blood cells form an abnormal crescent shape rather than a round shape.  LET YOUR CAREGIVER KNOW ABOUT:  Allergies.   Medicines taken including herbs, eyedrops, over-the-counter medicines, and creams.   Use of steroids (by mouth or creams).   Previous problems with anesthetics or numbing medicine.   History of blood clots.   History of bleeding or blood problems.   Previous surgery.   Possibility of pregnancy, if this applies.  RISKS AND COMPLICATIONS This is a simple and safe procedure. Problems are unlikely. However, problems can occur and may include:  Nausea or lightheadedness.   Low blood pressure.   Soreness, bleeding, swelling, or bruising at the needle insertion site.   Infection.  BEFORE THE PROCEDURE  This is a procedure that can be done as an outpatient. Confirm the time that you need to arrive for your procedure. Confirm whether there is a need to fast or withhold any medications. It is helpful to wear clothing with sleeves that can be raised above the elbow. A blood sample may be done  to determine the amount of red blood cells or iron in your blood. Plan ahead of time to have someone drive you home after the procedure. PROCEDURE The entire procedure from preparation through recovery takes about 1 hour. The actual collection takes about 10 to 15 minutes.  A needle will be inserted into your vein.   Tubing and a collection bag will be attached to that needle.   Blood will flow through the needle and tubing into the collection bag.   You may be asked to open and close your hand slowly and continuously during the entire collection.   Once the specified amount of blood has been removed from your body, the collection bag and tubing will be clamped.   The needle will be removed.   Pressure will be held on the site of the needle insertion to stop the bleeding. Then a bandage will be placed over the needle insertion site.  AFTER THE PROCEDURE  Your recovery will be assessed and monitored. If there are no problems, as an outpatient, you should be able to go home shortly after the procedure.  Document Released: 12/03/2010 Document Revised: 06/20/2011 Document Reviewed: 12/03/2010 [employer] Patient Information 2012 [employer]. 

## 2012-03-27 ENCOUNTER — Other Ambulatory Visit (HOSPITAL_BASED_OUTPATIENT_CLINIC_OR_DEPARTMENT_OTHER): Payer: 59 | Admitting: Lab

## 2012-03-27 ENCOUNTER — Ambulatory Visit (HOSPITAL_BASED_OUTPATIENT_CLINIC_OR_DEPARTMENT_OTHER): Payer: 59

## 2012-03-27 DIAGNOSIS — D45 Polycythemia vera: Secondary | ICD-10-CM

## 2012-03-27 DIAGNOSIS — D751 Secondary polycythemia: Secondary | ICD-10-CM

## 2012-03-27 LAB — CBC WITH DIFFERENTIAL/PLATELET
BASO%: 1.9 % (ref 0.0–2.0)
MCHC: 32.7 g/dL (ref 32.0–36.0)
MONO#: 0.5 10*3/uL (ref 0.1–0.9)
RBC: 6.5 10*6/uL — ABNORMAL HIGH (ref 4.20–5.82)
WBC: 6.8 10*3/uL (ref 4.0–10.3)
lymph#: 2.2 10*3/uL (ref 0.9–3.3)

## 2012-03-27 NOTE — Progress Notes (Signed)
Patient refused to eat, drink or remain for 30 mins post phlebotomy.

## 2012-03-27 NOTE — Patient Instructions (Signed)
Patient aware of next appointment; discharged home with no complaints. 

## 2012-04-24 ENCOUNTER — Ambulatory Visit: Payer: 59

## 2012-04-24 ENCOUNTER — Other Ambulatory Visit (HOSPITAL_BASED_OUTPATIENT_CLINIC_OR_DEPARTMENT_OTHER): Payer: 59 | Admitting: Lab

## 2012-04-24 ENCOUNTER — Telehealth: Payer: Self-pay | Admitting: Internal Medicine

## 2012-04-24 DIAGNOSIS — D45 Polycythemia vera: Secondary | ICD-10-CM

## 2012-04-24 DIAGNOSIS — D751 Secondary polycythemia: Secondary | ICD-10-CM

## 2012-04-24 LAB — CBC WITH DIFFERENTIAL/PLATELET
BASO%: 1.3 % (ref 0.0–2.0)
Basophils Absolute: 0.1 10*3/uL (ref 0.0–0.1)
HCT: 53.5 % — ABNORMAL HIGH (ref 38.4–49.9)
HGB: 17.3 g/dL — ABNORMAL HIGH (ref 13.0–17.1)
MONO#: 0.5 10*3/uL (ref 0.1–0.9)
NEUT%: 66.9 % (ref 39.0–75.0)
WBC: 8.4 10*3/uL (ref 4.0–10.3)
lymph#: 2 10*3/uL (ref 0.9–3.3)

## 2012-04-24 NOTE — Progress Notes (Signed)
Pt in for phlebotomy, BP low. Pt stated he will not stay after phlebotomy for monitoring. Reviewed with Dr. Darrold Span, as Dr. Arbutus Ped is out of the office. It was recommended to reschedule phlebotomy and have pt push PO fluids. Instructions given to pt. He agrees to reschedule. POF to schedulers for appt, pt asks to be called. Reinforced teaching re: increasing oral hydration. He verbalized understanding.

## 2012-04-24 NOTE — Telephone Encounter (Signed)
S/w pt re phleb appt for 10/14 @ 2 pm. Per Archie Patten 10/11 pof phleb r/s to 10/14 due to pt was not able to have phleb today.

## 2012-04-27 ENCOUNTER — Ambulatory Visit (HOSPITAL_BASED_OUTPATIENT_CLINIC_OR_DEPARTMENT_OTHER): Payer: 59

## 2012-04-27 DIAGNOSIS — D45 Polycythemia vera: Secondary | ICD-10-CM

## 2012-04-27 NOTE — Progress Notes (Signed)
1430 VSS. Phlebotomy completed using phlebotomy kit with 510g removed. Patient tolerated treatment well with no complaints of dizziness or light headedness. Patient refused drink and nourishment and refused to stay for 30 minutes of post observation.

## 2012-05-29 ENCOUNTER — Ambulatory Visit (HOSPITAL_BASED_OUTPATIENT_CLINIC_OR_DEPARTMENT_OTHER): Payer: 59 | Admitting: Physician Assistant

## 2012-05-29 ENCOUNTER — Encounter: Payer: Self-pay | Admitting: Physician Assistant

## 2012-05-29 ENCOUNTER — Other Ambulatory Visit: Payer: 59 | Admitting: Lab

## 2012-05-29 ENCOUNTER — Ambulatory Visit (HOSPITAL_BASED_OUTPATIENT_CLINIC_OR_DEPARTMENT_OTHER): Payer: 59

## 2012-05-29 ENCOUNTER — Telehealth: Payer: Self-pay | Admitting: Internal Medicine

## 2012-05-29 VITALS — BP 112/77 | HR 78 | Temp 98.5°F | Resp 20 | Ht 74.5 in | Wt 264.1 lb

## 2012-05-29 DIAGNOSIS — D45 Polycythemia vera: Secondary | ICD-10-CM

## 2012-05-29 DIAGNOSIS — D751 Secondary polycythemia: Secondary | ICD-10-CM

## 2012-05-29 LAB — CBC WITH DIFFERENTIAL/PLATELET
Basophils Absolute: 0.1 10*3/uL (ref 0.0–0.1)
EOS%: 3.3 % (ref 0.0–7.0)
Eosinophils Absolute: 0.2 10*3/uL (ref 0.0–0.5)
HCT: 50.4 % — ABNORMAL HIGH (ref 38.4–49.9)
HGB: 16.3 g/dL (ref 13.0–17.1)
MCH: 26.2 pg — ABNORMAL LOW (ref 27.2–33.4)
MCV: 81.2 fL (ref 79.3–98.0)
NEUT%: 52.4 % (ref 39.0–75.0)
lymph#: 2.4 10*3/uL (ref 0.9–3.3)

## 2012-05-29 NOTE — Telephone Encounter (Signed)
gv and printed appt schedule for pt for Dec....emailed Marcelino Duster to add phlebotomy...the patient aware

## 2012-05-29 NOTE — Patient Instructions (Addendum)
Continue monthly lab and phlebotomy appointments as scheduled Follow up in 4 months

## 2012-05-29 NOTE — Patient Instructions (Addendum)
Therapeutic Phlebotomy Therapeutic phlebotomy is the controlled removal of blood from your body for the purpose of treating a medical condition. It is similar to donating blood. Usually, about a pint (470 mL) of blood is removed. The average adult has 9 to 12 pints (4.3 to 5.7 L) of blood. Therapeutic phlebotomy may be used to treat the following medical conditions:  Hemochromatosis. This is a condition in which there is too much iron in the blood.  Polycythemia vera. This is a condition in which there are too many red cells in the blood.  Porphyria cutanea tarda. This is a disease usually passed from one generation to the next (inherited). It is a condition in which an important part of hemoglobin is not made properly. This results in the build up of abnormal amounts of porphyrins in the body.  Sickle cell disease. This is an inherited disease. It is a condition in which the red blood cells form an abnormal crescent shape rather than a round shape. LET YOUR CAREGIVER KNOW ABOUT:  Allergies.  Medicines taken including herbs, eyedrops, over-the-counter medicines, and creams.  Use of steroids (by mouth or creams).  Previous problems with anesthetics or numbing medicine.  History of blood clots.  History of bleeding or blood problems.  Previous surgery.  Possibility of pregnancy, if this applies. RISKS AND COMPLICATIONS This is a simple and safe procedure. Problems are unlikely. However, problems can occur and may include:  Nausea or lightheadedness.  Low blood pressure.  Soreness, bleeding, swelling, or bruising at the needle insertion site.  Infection. BEFORE THE PROCEDURE  This is a procedure that can be done as an outpatient. Confirm the time that you need to arrive for your procedure. Confirm whether there is a need to fast or withhold any medications. It is helpful to wear clothing with sleeves that can be raised above the elbow. A blood sample may be done to determine the  amount of red blood cells or iron in your blood. Plan ahead of time to have someone drive you home after the procedure. PROCEDURE The entire procedure from preparation through recovery takes about 1 hour. The actual collection takes about 10 to 15 minutes.  A needle will be inserted into your vein.  Tubing and a collection bag will be attached to that needle.  Blood will flow through the needle and tubing into the collection bag.  You may be asked to open and close your hand slowly and continuously during the entire collection.  Once the specified amount of blood has been removed from your body, the collection bag and tubing will be clamped.  The needle will be removed.  Pressure will be held on the site of the needle insertion to stop the bleeding. Then a bandage will be placed over the needle insertion site. AFTER THE PROCEDURE  Your recovery will be assessed and monitored. If there are no problems, as an outpatient, you should be able to go home shortly after the procedure.  Document Released: 12/03/2010 Document Revised: 09/23/2011 Document Reviewed: 12/03/2010 [employer] Patient Information 2013 [employer].  

## 2012-05-29 NOTE — Progress Notes (Signed)
Pt here for phlebotomy post f/u visit .   Phlebotomy performed in left posterior forearm with 18G x 1.16 in angiocath.  Approx.  598 gms of blood obtained and wasted.  Pt tolerated procedure without problems.  Nourishments given.  Pt declined to wait for 30 min. Post phlebotomy.   VSS.   Pt was stable at discharged via ambulation by self.  Pt understood to notify office with any new problems.

## 2012-06-04 NOTE — Progress Notes (Signed)
No images are attached to the encounter. No scans are attached to the encounter. No scans are attached to the encounter.   [facility] OFFICE PROGRESS NOTE  CC: Tamika J. Lazarus Salines, M.D., Lacretia Leigh. Hooper M.D.  DIAGNOSIS: Reactive polycythemia   CURRENT THERAPY: Phlebotomy on an as-needed basis in order to keep the hematocrit around 45%  INTERVAL HISTORY: Jesse Anderson 61 y.o. male returns for a  followup of his reactive polycythemia. Overall he has been doing relatively well since last being seen. He continues to smoke 2 packs of cigarettes daily. Today he voices no specific complaints.  He denied any headaches, chest pain, or decreased mentation.   MEDICAL HISTORY: Past Medical History  Diagnosis Date  . Hypertension   . Polycythemia vera   . Degenerative disk disease   . Hemorrhoids   . Polycythemia 05/24/2011    ALLERGIES:   has no known allergies.  MEDICATIONS:  Current Outpatient Prescriptions  Medication Sig Dispense Refill  . aspirin 325 MG tablet Take 325 mg by mouth daily.        . carisoprodol (SOMA) 350 MG tablet       . HYDROcodone-acetaminophen (VICODIN) 5-500 MG per tablet Take 1 tablet by mouth every 6 (six) hours as needed.        Marland Kitchen losartan (COZAAR) 50 MG tablet Take 50 mg by mouth daily.        . mometasone (ELOCON) 0.1 % cream       . Tadalafil (CIALIS PO) Take by mouth.          SURGICAL HISTORY:  Past Surgical History  Procedure Date  . Tonsillectomy   . Vasectomy   . Hernia repair     REVIEW OF SYSTEMS:  A comprehensive review of systems was negative.   PHYSICAL EXAMINATION: General appearance: alert, cooperative and no distress Head: Normocephalic, without obvious abnormality, atraumatic Neck: no adenopathy, no carotid bruit, no JVD, supple, symmetrical, trachea midline and thyroid not enlarged, symmetric, no tenderness/mass/nodules Lymph nodes: Cervical, supraclavicular, and axillary nodes normal. Resp: clear to auscultation  bilaterally Cardio: regular rate and rhythm, S1, S2 normal, no murmur, click, rub or gallop GI: soft, non-tender; bowel sounds normal; no masses,  no organomegaly Extremities: extremities normal, atraumatic, no cyanosis or edema   Blood pressure 112/77, pulse 78, temperature 98.5 F (36.9 C), temperature source Oral, resp. rate 20, height 6' 2.5" (1.892 m), weight 264 lb 1.6 oz (119.795 kg).  LABORATORY DATA: Lab Results  Component Value Date   WBC 7.0 05/29/2012   HGB 16.3 05/29/2012   HCT 50.4* 05/29/2012   MCV 81.2 05/29/2012   PLT 171 05/29/2012      Chemistry      Component Value Date/Time   NA 139 02/27/2012 1128   K 4.3 02/27/2012 1128   CL 106 02/27/2012 1128   CO2 26 02/27/2012 1128   BUN 15 02/27/2012 1128   CREATININE 0.90 02/27/2012 1128      Component Value Date/Time   CALCIUM 9.1 02/27/2012 1128   ALKPHOS 62 02/27/2012 1128   AST 22 02/27/2012 1128   ALT 30 02/27/2012 1128   BILITOT 0.4 02/27/2012 1128       RADIOGRAPHIC STUDIES:  No results found.  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 The patient was discussed with Dr. Darrold Span in Dr. Asa Lente absence. His hematocrit is 50.4% today and he will require phlebotomy of one unit of blood today. We  will set him up for monthly CBC differential and phlebotomy appointments again with the goal of keeping his hematocrit around 45%. Mr. Awan knows that if he has to adjust the appointments based on his work schedule to request both the lab and a phlebotomy appointments be moved together. We'll see him in followup in 4 months with repeat CBC differential C. met and LDH, as well as a phlebotomy appointment in the event that it is needed. He is encouraged to proceed with his smoking cessation efforts.    Laural Benes, Jesse Ohmer E, PA-C     All questions were answered. The patient knows to call the clinic with any problems, questions or concerns. We can  certainly see the patient much sooner if necessary.

## 2012-06-26 ENCOUNTER — Ambulatory Visit (HOSPITAL_BASED_OUTPATIENT_CLINIC_OR_DEPARTMENT_OTHER): Payer: 59

## 2012-06-26 ENCOUNTER — Other Ambulatory Visit (HOSPITAL_BASED_OUTPATIENT_CLINIC_OR_DEPARTMENT_OTHER): Payer: 59 | Admitting: Lab

## 2012-06-26 DIAGNOSIS — D751 Secondary polycythemia: Secondary | ICD-10-CM

## 2012-06-26 DIAGNOSIS — D45 Polycythemia vera: Secondary | ICD-10-CM

## 2012-06-26 LAB — CBC WITH DIFFERENTIAL/PLATELET
Basophils Absolute: 0.1 10*3/uL (ref 0.0–0.1)
EOS%: 2.4 % (ref 0.0–7.0)
HGB: 16.8 g/dL (ref 13.0–17.1)
LYMPH%: 22 % (ref 14.0–49.0)
MCH: 25.3 pg — ABNORMAL LOW (ref 27.2–33.4)
MCV: 79.5 fL (ref 79.3–98.0)
MONO%: 4.8 % (ref 0.0–14.0)
NEUT%: 69.8 % (ref 39.0–75.0)
Platelets: 179 10*3/uL (ref 140–400)
RDW: 16.1 % — ABNORMAL HIGH (ref 11.0–14.6)

## 2012-06-26 NOTE — Patient Instructions (Signed)
Therapeutic Phlebotomy Therapeutic phlebotomy is the controlled removal of blood from your body for the purpose of treating a medical condition. It is similar to donating blood. Usually, about a pint (470 mL) of blood is removed. The average adult has 9 to 12 pints (4.3 to 5.7 L) of blood. Therapeutic phlebotomy may be used to treat the following medical conditions:  Hemochromatosis. This is a condition in which there is too much iron in the blood.  Polycythemia vera. This is a condition in which there are too many red cells in the blood.  Porphyria cutanea tarda. This is a disease usually passed from one generation to the next (inherited). It is a condition in which an important part of hemoglobin is not made properly. This results in the build up of abnormal amounts of porphyrins in the body.  Sickle cell disease. This is an inherited disease. It is a condition in which the red blood cells form an abnormal crescent shape rather than a round shape. LET YOUR CAREGIVER KNOW ABOUT:  Allergies.  Medicines taken including herbs, eyedrops, over-the-counter medicines, and creams.  Use of steroids (by mouth or creams).  Previous problems with anesthetics or numbing medicine.  History of blood clots.  History of bleeding or blood problems.  Previous surgery.  Possibility of pregnancy, if this applies. RISKS AND COMPLICATIONS This is a simple and safe procedure. Problems are unlikely. However, problems can occur and may include:  Nausea or lightheadedness.  Low blood pressure.  Soreness, bleeding, swelling, or bruising at the needle insertion site.  Infection. BEFORE THE PROCEDURE  This is a procedure that can be done as an outpatient. Confirm the time that you need to arrive for your procedure. Confirm whether there is a need to fast or withhold any medications. It is helpful to wear clothing with sleeves that can be raised above the elbow. A blood sample may be done to determine the  amount of red blood cells or iron in your blood. Plan ahead of time to have someone drive you home after the procedure. PROCEDURE The entire procedure from preparation through recovery takes about 1 hour. The actual collection takes about 10 to 15 minutes.  A needle will be inserted into your vein.  Tubing and a collection bag will be attached to that needle.  Blood will flow through the needle and tubing into the collection bag.  You may be asked to open and close your hand slowly and continuously during the entire collection.  Once the specified amount of blood has been removed from your body, the collection bag and tubing will be clamped.  The needle will be removed.  Pressure will be held on the site of the needle insertion to stop the bleeding. Then a bandage will be placed over the needle insertion site. AFTER THE PROCEDURE  Your recovery will be assessed and monitored. If there are no problems, as an outpatient, you should be able to go home shortly after the procedure.  Document Released: 12/03/2010 Document Revised: 09/23/2011 Document Reviewed: 12/03/2010 [employer] Patient Information 2013 [employer].  

## 2012-06-26 NOTE — Progress Notes (Signed)
Hct 52.8 performed Phlebotomy per protocol. Removed 298cc over 7 mins from Left AC with 16G needle, line occluded. Pt refused additional blood to be taken. Nourishment provided. Pt stayed , refused to stay longer.

## 2012-07-24 ENCOUNTER — Other Ambulatory Visit (HOSPITAL_BASED_OUTPATIENT_CLINIC_OR_DEPARTMENT_OTHER): Payer: 59 | Admitting: Lab

## 2012-07-24 ENCOUNTER — Ambulatory Visit (HOSPITAL_BASED_OUTPATIENT_CLINIC_OR_DEPARTMENT_OTHER): Payer: 59

## 2012-07-24 VITALS — BP 123/65 | HR 81 | Temp 98.6°F | Resp 20

## 2012-07-24 DIAGNOSIS — D751 Secondary polycythemia: Secondary | ICD-10-CM

## 2012-07-24 DIAGNOSIS — D45 Polycythemia vera: Secondary | ICD-10-CM

## 2012-07-24 LAB — CBC WITH DIFFERENTIAL/PLATELET
EOS%: 6.7 % (ref 0.0–7.0)
LYMPH%: 35 % (ref 14.0–49.0)
MCH: 25.1 pg — ABNORMAL LOW (ref 27.2–33.4)
MCHC: 32.4 g/dL (ref 32.0–36.0)
MCV: 77.2 fL — ABNORMAL LOW (ref 79.3–98.0)
MONO%: 7.8 % (ref 0.0–14.0)
NEUT#: 3.1 10*3/uL (ref 1.5–6.5)
Platelets: 199 10*3/uL (ref 140–400)
RBC: 6.15 10*6/uL — ABNORMAL HIGH (ref 4.20–5.82)
RDW: 16.9 % — ABNORMAL HIGH (ref 11.0–14.6)

## 2012-07-24 NOTE — Progress Notes (Signed)
Pre and post vital signs stable; no complaints from patient at discharge

## 2012-07-24 NOTE — Patient Instructions (Addendum)
Patient aware of next appointment; discharged home with no complaints. 

## 2012-08-21 ENCOUNTER — Ambulatory Visit (HOSPITAL_BASED_OUTPATIENT_CLINIC_OR_DEPARTMENT_OTHER): Payer: 59

## 2012-08-21 ENCOUNTER — Other Ambulatory Visit (HOSPITAL_BASED_OUTPATIENT_CLINIC_OR_DEPARTMENT_OTHER): Payer: 59 | Admitting: Lab

## 2012-08-21 DIAGNOSIS — D45 Polycythemia vera: Secondary | ICD-10-CM

## 2012-08-21 DIAGNOSIS — D751 Secondary polycythemia: Secondary | ICD-10-CM

## 2012-08-21 LAB — CBC WITH DIFFERENTIAL/PLATELET
Basophils Absolute: 0.1 10*3/uL (ref 0.0–0.1)
Eosinophils Absolute: 0.2 10*3/uL (ref 0.0–0.5)
HCT: 50.1 % — ABNORMAL HIGH (ref 38.4–49.9)
HGB: 16.1 g/dL (ref 13.0–17.1)
LYMPH%: 31.1 % (ref 14.0–49.0)
MCV: 75.8 fL — ABNORMAL LOW (ref 79.3–98.0)
MONO%: 7.4 % (ref 0.0–14.0)
NEUT#: 5.2 10*3/uL (ref 1.5–6.5)
NEUT%: 57.7 % (ref 39.0–75.0)
Platelets: 209 10*3/uL (ref 140–400)

## 2012-09-18 ENCOUNTER — Ambulatory Visit: Payer: 59 | Admitting: Physician Assistant

## 2012-09-18 ENCOUNTER — Other Ambulatory Visit: Payer: 59 | Admitting: Lab

## 2012-09-21 ENCOUNTER — Telehealth: Payer: Self-pay | Admitting: Medical Oncology

## 2012-09-21 NOTE — Telephone Encounter (Signed)
I transferred pt call to scheduler to r/s missed appt from 37

## 2012-09-22 ENCOUNTER — Telehealth: Payer: Self-pay | Admitting: Internal Medicine

## 2012-09-28 ENCOUNTER — Ambulatory Visit (HOSPITAL_BASED_OUTPATIENT_CLINIC_OR_DEPARTMENT_OTHER): Payer: 59 | Admitting: Physician Assistant

## 2012-09-28 ENCOUNTER — Ambulatory Visit (HOSPITAL_BASED_OUTPATIENT_CLINIC_OR_DEPARTMENT_OTHER): Payer: 59

## 2012-09-28 ENCOUNTER — Other Ambulatory Visit (HOSPITAL_BASED_OUTPATIENT_CLINIC_OR_DEPARTMENT_OTHER): Payer: 59 | Admitting: Lab

## 2012-09-28 ENCOUNTER — Ambulatory Visit: Payer: 59 | Admitting: Physician Assistant

## 2012-09-28 ENCOUNTER — Other Ambulatory Visit: Payer: 59 | Admitting: Lab

## 2012-09-28 ENCOUNTER — Encounter: Payer: Self-pay | Admitting: Physician Assistant

## 2012-09-28 VITALS — BP 120/77 | HR 90 | Temp 98.1°F | Resp 20 | Ht 74.5 in | Wt 262.0 lb

## 2012-09-28 DIAGNOSIS — F17200 Nicotine dependence, unspecified, uncomplicated: Secondary | ICD-10-CM

## 2012-09-28 DIAGNOSIS — D751 Secondary polycythemia: Secondary | ICD-10-CM

## 2012-09-28 LAB — CBC WITH DIFFERENTIAL/PLATELET
BASO%: 2.8 % — ABNORMAL HIGH (ref 0.0–2.0)
LYMPH%: 23.7 % (ref 14.0–49.0)
MCHC: 31.7 g/dL — ABNORMAL LOW (ref 32.0–36.0)
MCV: 73.2 fL — ABNORMAL LOW (ref 79.3–98.0)
MONO%: 3.6 % (ref 0.0–14.0)
NEUT%: 67.7 % (ref 39.0–75.0)
Platelets: 226 10*3/uL (ref 140–400)
RBC: 6.69 10*6/uL — ABNORMAL HIGH (ref 4.20–5.82)

## 2012-09-28 NOTE — Progress Notes (Signed)
No images are attached to the encounter. No scans are attached to the encounter. No scans are attached to the encounter.   [facility] OFFICE PROGRESS NOTE  CC: Tamika J. Lazarus Salines, M.D., Lacretia Leigh. Hooper M.D.  DIAGNOSIS: Reactive polycythemia   CURRENT THERAPY: Phlebotomy on an as-needed basis in order to keep the hematocrit around 45%  INTERVAL HISTORY: Jesse Anderson 62 y.o. male returns for a  followup of his reactive polycythemia. Overall he has been doing relatively well since last being seen. He continues to smoke 1 -2 packs of cigarettes daily. Today he voices no specific complaints.  He denied any headaches, chest pain, or decreased mentation. He states he did purchase a "vapor" cigarette and will continue to try to use this to discontinue smoking.  MEDICAL HISTORY: Past Medical History  Diagnosis Date  . Hypertension   . Polycythemia vera   . Degenerative disk disease   . Hemorrhoids   . Polycythemia 05/24/2011    ALLERGIES:  has No Known Allergies.  MEDICATIONS:  Current Outpatient Prescriptions  Medication Sig Dispense Refill  . aspirin 325 MG tablet Take 325 mg by mouth daily.        . carisoprodol (SOMA) 350 MG tablet       . HYDROcodone-acetaminophen (VICODIN) 5-500 MG per tablet Take 1 tablet by mouth every 6 (six) hours as needed.        Marland Kitchen losartan (COZAAR) 50 MG tablet Take 50 mg by mouth daily.        . mometasone (ELOCON) 0.1 % cream       . Tadalafil (CIALIS PO) Take by mouth.         No current facility-administered medications for this visit.    SURGICAL HISTORY:  Past Surgical History  Procedure Laterality Date  . Tonsillectomy    . Vasectomy    . Hernia repair      REVIEW OF SYSTEMS:  A comprehensive review of systems was negative.   PHYSICAL EXAMINATION: General appearance: alert, cooperative and no distress Head: Normocephalic, without obvious abnormality, atraumatic Neck: no adenopathy, no carotid bruit, no JVD, supple,  symmetrical, trachea midline and thyroid not enlarged, symmetric, no tenderness/mass/nodules Lymph nodes: Cervical, supraclavicular, and axillary nodes normal. Resp: clear to auscultation bilaterally Cardio: regular rate and rhythm, S1, S2 normal, no murmur, click, rub or gallop GI: soft, non-tender; bowel sounds normal; no masses,  no organomegaly Extremities: extremities normal, atraumatic, no cyanosis or edema   Blood pressure 120/77, pulse 90, temperature 98.1 F (36.7 C), temperature source Oral, resp. rate 20, height 6' 2.5" (1.892 m), weight 262 lb (118.842 kg).  LABORATORY DATA: Lab Results  Component Value Date   WBC 9.9 09/28/2012   HGB 15.5 09/28/2012   HCT 49.0 09/28/2012   MCV 73.2* 09/28/2012   PLT 226 09/28/2012      Chemistry      Component Value Date/Time   NA 139 02/27/2012 1128   K 4.3 02/27/2012 1128   CL 106 02/27/2012 1128   CO2 26 02/27/2012 1128   BUN 15 02/27/2012 1128   CREATININE 0.90 02/27/2012 1128      Component Value Date/Time   CALCIUM 9.1 02/27/2012 1128   ALKPHOS 62 02/27/2012 1128   AST 22 02/27/2012 1128   ALT 30 02/27/2012 1128   BILITOT 0.4 02/27/2012 1128       RADIOGRAPHIC STUDIES:  No results found.  ASSESSMENT/PLAN: This is a  very pleasant 62 year old white male with reactive polycythemia secondary to  smoking. The patient was discussed with Dr. Arbutus Ped. His hematocrit is 49.0% today and he will require phlebotomy of one unit of blood today. We will have him return in 3 months for another symptom management visit with a repeat CBC differential and C. met and we'll set him up for a phlebotomy appointment.  He is encouraged to proceed with his smoking cessation efforts.    Laural Benes, Hitesh Fouche E, PA-C     All questions were answered. The patient knows to call the clinic with any problems, questions or concerns. We can certainly see the patient much sooner if necessary.

## 2012-09-28 NOTE — Patient Instructions (Addendum)
Your hematocrit is elevated today and will require phlebotomy Followup in 3 months with repeat lab a possible phlebotomy

## 2012-09-28 NOTE — Progress Notes (Signed)
@  1545 phlebotomy started Lft AC, 500g removed.  Stopped 1555.  Pt refused any nutrition or observation period  Tolerated well .  Ambulatory  dmr

## 2012-09-28 NOTE — Patient Instructions (Addendum)
Therapeutic Phlebotomy Therapeutic phlebotomy is the controlled removal of blood from your body for the purpose of treating a medical condition. It is similar to donating blood. Usually, about a pint (470 mL) of blood is removed. The average adult has 9 to 12 pints (4.3 to 5.7 L) of blood. Therapeutic phlebotomy may be used to treat the following medical conditions:  Hemochromatosis. This is a condition in which there is too much iron in the blood.  Polycythemia vera. This is a condition in which there are too many red cells in the blood.  Porphyria cutanea tarda. This is a disease usually passed from one generation to the next (inherited). It is a condition in which an important part of hemoglobin is not made properly. This results in the build up of abnormal amounts of porphyrins in the body.  Sickle cell disease. This is an inherited disease. It is a condition in which the red blood cells form an abnormal crescent shape rather than a round shape. LET YOUR CAREGIVER KNOW ABOUT:  Allergies.  Medicines taken including herbs, eyedrops, over-the-counter medicines, and creams.  Use of steroids (by mouth or creams).  Previous problems with anesthetics or numbing medicine.  History of blood clots.  History of bleeding or blood problems.  Previous surgery.  Possibility of pregnancy, if this applies. RISKS AND COMPLICATIONS This is a simple and safe procedure. Problems are unlikely. However, problems can occur and may include:  Nausea or lightheadedness.  Low blood pressure.  Soreness, bleeding, swelling, or bruising at the needle insertion site.  Infection. BEFORE THE PROCEDURE  This is a procedure that can be done as an outpatient. Confirm the time that you need to arrive for your procedure. Confirm whether there is a need to fast or withhold any medications. It is helpful to wear clothing with sleeves that can be raised above the elbow. A blood sample may be done to determine the  amount of red blood cells or iron in your blood. Plan ahead of time to have someone drive you home after the procedure. PROCEDURE The entire procedure from preparation through recovery takes about 1 hour. The actual collection takes about 10 to 15 minutes.  A needle will be inserted into your vein.  Tubing and a collection bag will be attached to that needle.  Blood will flow through the needle and tubing into the collection bag.  You may be asked to open and close your hand slowly and continuously during the entire collection.  Once the specified amount of blood has been removed from your body, the collection bag and tubing will be clamped.  The needle will be removed.  Pressure will be held on the site of the needle insertion to stop the bleeding. Then a bandage will be placed over the needle insertion site. AFTER THE PROCEDURE  Your recovery will be assessed and monitored. If there are no problems, as an outpatient, you should be able to go home shortly after the procedure.  Document Released: 12/03/2010 Document Revised: 09/23/2011 Document Reviewed: 12/03/2010 [employer] Patient Information 2013 [employer].  

## 2012-09-30 ENCOUNTER — Telehealth: Payer: Self-pay | Admitting: Internal Medicine

## 2012-09-30 NOTE — Telephone Encounter (Signed)
S/w pt wife re appt for 6/20.

## 2012-11-17 ENCOUNTER — Other Ambulatory Visit (INDEPENDENT_AMBULATORY_CARE_PROVIDER_SITE_OTHER): Payer: Self-pay | Admitting: Otolaryngology

## 2012-11-17 DIAGNOSIS — H9210 Otorrhea, unspecified ear: Secondary | ICD-10-CM

## 2012-11-19 ENCOUNTER — Ambulatory Visit
Admission: RE | Admit: 2012-11-19 | Discharge: 2012-11-19 | Disposition: A | Discharge status: Home / Self Care | Payer: 59 | Source: Ambulatory Visit | Attending: Otolaryngology | Admitting: Otolaryngology

## 2012-11-19 DIAGNOSIS — H9210 Otorrhea, unspecified ear: Secondary | ICD-10-CM

## 2013-01-01 ENCOUNTER — Ambulatory Visit (HOSPITAL_BASED_OUTPATIENT_CLINIC_OR_DEPARTMENT_OTHER): Payer: 59

## 2013-01-01 ENCOUNTER — Telehealth: Payer: Self-pay | Admitting: *Deleted

## 2013-01-01 ENCOUNTER — Telehealth: Payer: Self-pay | Admitting: Internal Medicine

## 2013-01-01 ENCOUNTER — Ambulatory Visit (HOSPITAL_BASED_OUTPATIENT_CLINIC_OR_DEPARTMENT_OTHER): Payer: 59 | Admitting: Internal Medicine

## 2013-01-01 ENCOUNTER — Encounter: Payer: Self-pay | Admitting: Internal Medicine

## 2013-01-01 ENCOUNTER — Other Ambulatory Visit (HOSPITAL_BASED_OUTPATIENT_CLINIC_OR_DEPARTMENT_OTHER): Payer: 59

## 2013-01-01 ENCOUNTER — Ambulatory Visit: Payer: 59 | Admitting: Physician Assistant

## 2013-01-01 VITALS — BP 96/63 | HR 95 | Temp 97.6°F | Resp 18 | Ht 74.5 in | Wt 256.0 lb

## 2013-01-01 DIAGNOSIS — I10 Essential (primary) hypertension: Secondary | ICD-10-CM

## 2013-01-01 DIAGNOSIS — F17200 Nicotine dependence, unspecified, uncomplicated: Secondary | ICD-10-CM

## 2013-01-01 DIAGNOSIS — D751 Secondary polycythemia: Secondary | ICD-10-CM

## 2013-01-01 LAB — CBC WITH DIFFERENTIAL/PLATELET
BASO%: 1.3 % (ref 0.0–2.0)
Basophils Absolute: 0.1 10*3/uL (ref 0.0–0.1)
EOS%: 2.9 % (ref 0.0–7.0)
HGB: 17.1 g/dL (ref 13.0–17.1)
MCH: 23.2 pg — ABNORMAL LOW (ref 27.2–33.4)
MCHC: 32.4 g/dL (ref 32.0–36.0)
MONO%: 6.6 % (ref 0.0–14.0)
RBC: 7.36 10*6/uL — ABNORMAL HIGH (ref 4.20–5.82)
RDW: 19.6 % — ABNORMAL HIGH (ref 11.0–14.6)
lymph#: 2.6 10*3/uL (ref 0.9–3.3)

## 2013-01-01 NOTE — Patient Instructions (Signed)
We will proceed with phlebotomy today. Follow up visit in 3 months with repeat CBC and phlebotomy as needed

## 2013-01-01 NOTE — Progress Notes (Signed)
[facility] [phone]   Fax:(336) [phone]  OFFICE PROGRESS NOTE  Quentin Mulling, MD [address] [place] [address]  DIAGNOSIS: Reactive polycythemia   CURRENT THERAPY: Phlebotomy on an as-needed basis in order to keep the hematocrit around 45%  INTERVAL HISTORY: Jesse Anderson 62 y.o. male returns to the clinic today for routine three-month follow up visit. The patient is feeling fine today with no specific complaints except for occasional dizzy spells because of low blood pressure. He is currently on Cozaar 50 mg by mouth daily. He does not have a family physician but close to urgent care for medication refill. He denied having any bleeding issues. He denied having any chest pain, shortness of breath, cough or hemoptysis. He has repeat CBC performed earlier today and he is here for evaluation and discussion of his lab results.  MEDICAL HISTORY: Past Medical History  Diagnosis Date  . Hypertension   . 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 Degenerative disk disease   . Hemorrhoids   . Polycythemia 05/24/2011    ALLERGIES:  has No Known Allergies.  MEDICATIONS:  Current Outpatient Prescriptions  Medication Sig Dispense Refill  . aspirin 325 MG tablet Take 325 mg by mouth daily.        Marland Kitchen HYDROcodone-acetaminophen (VICODIN) 5-500 MG per tablet Take 1 tablet by mouth every 6 (six) hours as needed.        Marland Kitchen losartan (COZAAR) 50 MG tablet Take 50 mg by mouth daily.        . mometasone (ELOCON) 0.1 % cream       . Tadalafil (CIALIS PO) Take by mouth.         No current facility-administered medications for this visit.    SURGICAL HISTORY:  Past Surgical History  Procedure Laterality Date  . Tonsillectomy    . Vasectomy    . Hernia repair      REVIEW OF SYSTEMS:  A comprehensive review of systems was negative.   PHYSICAL EXAMINATION: General appearance: alert, cooperative and no distress Head: Normocephalic, without obvious abnormality,  atraumatic Neck: no adenopathy Lymph nodes: Cervical, supraclavicular, and axillary nodes normal. Resp: clear to auscultation bilaterally Cardio: regular rate and rhythm, S1, S2 normal, no murmur, click, rub or gallop GI: soft, non-tender; bowel sounds normal; no masses,  no organomegaly Extremities: extremities normal, atraumatic, no cyanosis or edema  ECOG PERFORMANCE STATUS: 0 - Asymptomatic  Blood pressure 96/63, pulse 95, temperature 97.6 F (36.4 C), temperature source Oral, resp. rate 18, height 6' 2.5" (1.892 m), weight 256 lb (116.121 kg).  LABORATORY DATA: Lab Results  Component Value Date   WBC 10.1 01/01/2013   HGB 17.1 01/01/2013   HCT 52.6* 01/01/2013   MCV 71.5* 01/01/2013   PLT 188 01/01/2013      Chemistry      Component Value Date/Time   NA 139 02/27/2012 1128   K 4.3 02/27/2012 1128   CL 106 02/27/2012 1128   CO2 26 02/27/2012 1128   BUN 15 02/27/2012 1128   CREATININE 0.90 02/27/2012 1128      Component Value Date/Time   CALCIUM 9.1 02/27/2012 1128   ALKPHOS 62 02/27/2012 1128   AST 22 02/27/2012 1128   ALT 30 02/27/2012 1128   BILITOT 0.4 02/27/2012 1128       RADIOGRAPHIC STUDIES: No results found.  ASSESSMENT AND PLAN: this is a very pleasant 62 years old white male with reactive polycythemia secondary to long smoking history. I  strongly urge the patient to quit smoking. I recommended for him to proceed with phlebotomy today to keep his hematocrit around 45%. I would see him back for followup visit in 3 months with repeat CBC and phlebotomy as needed. History of hypertension with recent episodes of dizzy spells secondary to hypertension from blood pressure medication. I recommended for the patient to decrease the dose of Cozaar to 25 mg by mouth daily and to have close monitoring of his blood pressure. He was also advised to see his urgent care doctor as needed for adjustment of his blood pressure. He was advised to call immediately if he has any concerning  symptoms in the interval.  All questions were answered. The patient knows to call the clinic with any problems, questions or concerns. We can certainly see the patient much sooner if necessary.

## 2013-01-01 NOTE — Telephone Encounter (Signed)
Per staff message and POF I have scheduled appts.  JMW  

## 2013-03-01 ENCOUNTER — Ambulatory Visit (HOSPITAL_BASED_OUTPATIENT_CLINIC_OR_DEPARTMENT_OTHER): Payer: 59 | Admitting: Internal Medicine

## 2013-03-01 ENCOUNTER — Encounter: Payer: Self-pay | Admitting: Internal Medicine

## 2013-03-01 ENCOUNTER — Ambulatory Visit (HOSPITAL_BASED_OUTPATIENT_CLINIC_OR_DEPARTMENT_OTHER): Payer: 59

## 2013-03-01 ENCOUNTER — Other Ambulatory Visit (HOSPITAL_BASED_OUTPATIENT_CLINIC_OR_DEPARTMENT_OTHER): Payer: 59 | Admitting: Lab

## 2013-03-01 VITALS — BP 113/76 | HR 76 | Temp 97.8°F | Resp 20 | Ht 74.5 in | Wt 265.6 lb

## 2013-03-01 DIAGNOSIS — D751 Secondary polycythemia: Secondary | ICD-10-CM

## 2013-03-01 DIAGNOSIS — F17200 Nicotine dependence, unspecified, uncomplicated: Secondary | ICD-10-CM

## 2013-03-01 LAB — CBC WITH DIFFERENTIAL/PLATELET
Basophils Absolute: 0 10*3/uL (ref 0.0–0.1)
Eosinophils Absolute: 0.2 10*3/uL (ref 0.0–0.5)
HGB: 16.4 g/dL (ref 13.0–17.1)
MONO#: 0.4 10*3/uL (ref 0.1–0.9)
NEUT#: 3.8 10*3/uL (ref 1.5–6.5)
RBC: 6.83 10*6/uL — ABNORMAL HIGH (ref 4.20–5.82)
RDW: 19.5 % — ABNORMAL HIGH (ref 11.0–14.6)
WBC: 6.1 10*3/uL (ref 4.0–10.3)
lymph#: 1.8 10*3/uL (ref 0.9–3.3)

## 2013-03-01 LAB — LACTATE DEHYDROGENASE (CC13): LDH: 152 U/L (ref 125–245)

## 2013-03-01 LAB — COMPREHENSIVE METABOLIC PANEL (CC13)
ALT: 25 U/L (ref 0–55)
Albumin: 3.6 g/dL (ref 3.5–5.0)
CO2: 22 mEq/L (ref 22–29)
Glucose: 130 mg/dl (ref 70–140)
Potassium: 4 mEq/L (ref 3.5–5.1)
Sodium: 142 mEq/L (ref 136–145)
Total Bilirubin: 0.4 mg/dL (ref 0.20–1.20)
Total Protein: 7.3 g/dL (ref 6.4–8.3)

## 2013-03-01 NOTE — Patient Instructions (Addendum)
Therapeutic Phlebotomy Therapeutic phlebotomy is the controlled removal of blood from your body for the purpose of treating a medical condition. It is similar to donating blood. Usually, about a pint (470 mL) of blood is removed. The average adult has 9 to 12 pints (4.3 to 5.7 L) of blood. Therapeutic phlebotomy may be used to treat the following medical conditions:  Hemochromatosis. This is a condition in which there is too much iron in the blood.  Polycythemia vera. This is a condition in which there are too many red cells in the blood.  Porphyria cutanea tarda. This is a disease usually passed from one generation to the next (inherited). It is a condition in which an important part of hemoglobin is not made properly. This results in the build up of abnormal amounts of porphyrins in the body.  Sickle cell disease. This is an inherited disease. It is a condition in which the red blood cells form an abnormal crescent shape rather than a round shape. LET YOUR CAREGIVER KNOW ABOUT:  Allergies.  Medicines taken including herbs, eyedrops, over-the-counter medicines, and creams.  Use of steroids (by mouth or creams).  Previous problems with anesthetics or numbing medicine.  History of blood clots.  History of bleeding or blood problems.  Previous surgery.  Possibility of pregnancy, if this applies. RISKS AND COMPLICATIONS This is a simple and safe procedure. Problems are unlikely. However, problems can occur and may include:  Nausea or lightheadedness.  Low blood pressure.  Soreness, bleeding, swelling, or bruising at the needle insertion site.  Infection. BEFORE THE PROCEDURE  This is a procedure that can be done as an outpatient. Confirm the time that you need to arrive for your procedure. Confirm whether there is a need to fast or withhold any medications. It is helpful to wear clothing with sleeves that can be raised above the elbow. A blood sample may be done to determine the  amount of red blood cells or iron in your blood. Plan ahead of time to have someone drive you home after the procedure. PROCEDURE The entire procedure from preparation through recovery takes about 1 hour. The actual collection takes about 10 to 15 minutes.  A needle will be inserted into your vein.  Tubing and a collection bag will be attached to that needle.  Blood will flow through the needle and tubing into the collection bag.  You may be asked to open and close your hand slowly and continuously during the entire collection.  Once the specified amount of blood has been removed from your body, the collection bag and tubing will be clamped.  The needle will be removed.  Pressure will be held on the site of the needle insertion to stop the bleeding. Then a bandage will be placed over the needle insertion site. AFTER THE PROCEDURE  Your recovery will be assessed and monitored. If there are no problems, as an outpatient, you should be able to go home shortly after the procedure.  Document Released: 12/03/2010 Document Revised: 09/23/2011 Document Reviewed: 12/03/2010 [employer] Patient Information 2014 [employer].  

## 2013-03-01 NOTE — Patient Instructions (Signed)
Phlebotomy today. Followup visit in 3 months.

## 2013-03-01 NOTE — Progress Notes (Signed)
Phlebotomized 500 grams out of left AC today. Patient tolerated well. VSS post phlebotomy. Patient didn't stay for the 30 minute observation period.

## 2013-03-01 NOTE — Progress Notes (Signed)
[facility] Telephone:(336) [phone]   Fax:(336) [phone]  OFFICE PROGRESS NOTE  Jesse Mulling, MD [address] [place] [address]  DIAGNOSIS: Reactive polycythemia   CURRENT THERAPY: Phlebotomy on an as-needed basis in order to keep the hematocrit around 45%  INTERVAL HISTORY: Jesse Anderson 62 y.o. male returns to the clinic today for two-month followup visit. The patient is feeling fine today with no specific complaints. He denied having any significant weight loss or night sweats. He denied having any headache or pain or vision. The patient denied having any chest pain, shortness breath, cough or hemoptysis. He has repeat CBC performed earlier today and he is here for evaluation and discussion of his lab results.  MEDICAL HISTORY: Past Medical History  Diagnosis Date  . Hypertension   . 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 Degenerative disk disease   . Hemorrhoids   . Polycythemia 05/24/2011    ALLERGIES:  has No Known Allergies.  MEDICATIONS:  Current Outpatient Prescriptions  Medication Sig Dispense Refill  . aspirin 325 MG tablet Take 325 mg by mouth daily.        Marland Kitchen HYDROcodone-acetaminophen (VICODIN) 5-500 MG per tablet Take 1 tablet by mouth every 6 (six) hours as needed.        Marland Kitchen losartan (COZAAR) 50 MG tablet Take 50 mg by mouth daily.        . mometasone (ELOCON) 0.1 % cream        No current facility-administered medications for this visit.    SURGICAL HISTORY:  Past Surgical History  Procedure Laterality Date  . Tonsillectomy    . Vasectomy    . Hernia repair      REVIEW OF SYSTEMS:  A comprehensive review of systems was negative.   PHYSICAL EXAMINATION: General appearance: alert, cooperative and no distress Head: Normocephalic, without obvious abnormality, atraumatic Neck: no adenopathy Lymph nodes: Cervical, supraclavicular, and axillary nodes normal. Resp: clear to auscultation bilaterally Cardio: regular rate and rhythm, S1,  S2 normal, no murmur, click, rub or gallop GI: soft, non-tender; bowel sounds normal; no masses,  no organomegaly Extremities: extremities normal, atraumatic, no cyanosis or edema  ECOG PERFORMANCE STATUS: 0 - Asymptomatic  Blood pressure 113/76, pulse 76, temperature 97.8 F (36.6 C), temperature source Oral, resp. rate 20, height 6' 2.5" (1.892 m), weight 265 lb 9.6 oz (120.475 kg).  LABORATORY DATA: Lab Results  Component Value Date   WBC 6.1 03/01/2013   HGB 16.4 03/01/2013   HCT 51.3* 03/01/2013   MCV 75.1* 03/01/2013   PLT 181 03/01/2013      Chemistry      Component Value Date/Time   NA 142 03/01/2013 1010   NA 139 02/27/2012 1128   K 4.0 03/01/2013 1010   K 4.3 02/27/2012 1128   CL 106 02/27/2012 1128   CO2 22 03/01/2013 1010   CO2 26 02/27/2012 1128   BUN 10.0 03/01/2013 1010   BUN 15 02/27/2012 1128   CREATININE 0.9 03/01/2013 1010   CREATININE 0.90 02/27/2012 1128      Component Value Date/Time   CALCIUM 9.0 03/01/2013 1010   CALCIUM 9.1 02/27/2012 1128   ALKPHOS 56 03/01/2013 1010   ALKPHOS 62 02/27/2012 1128   AST 22 03/01/2013 1010   AST 22 02/27/2012 1128   ALT 25 03/01/2013 1010   ALT 30 02/27/2012 1128   BILITOT 0.40 03/01/2013 1010   BILITOT 0.4 02/27/2012 1128       RADIOGRAPHIC STUDIES: No results  found.  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 His hemoglobin and hematocrit are elevated today. I recommended for the patient to proceed with phlebotomy to keep his hematocrit around 45% He would come back for followup visit in 3 months with repeat CBC, comprehensive metabolic panel, LDH and repeat phlebotomy as needed. He was advised to call immediately she has any concerning symptoms in the interval. The patient voices understanding of current disease status and treatment options and is in agreement with the current  care plan.  All questions were answered. The patient knows to call the clinic with any problems, questions or concerns. We can certainly see the patient much sooner if necessary.

## 2013-03-02 ENCOUNTER — Telehealth: Payer: Self-pay | Admitting: *Deleted

## 2013-03-02 ENCOUNTER — Telehealth: Payer: Self-pay | Admitting: Internal Medicine

## 2013-03-02 NOTE — Telephone Encounter (Signed)
Per staff message and POF I have scheduled appts.  JMW  

## 2013-03-02 NOTE — Telephone Encounter (Signed)
s.w. pt and advised on nove appt...pt  ok and aware

## 2013-05-24 ENCOUNTER — Encounter: Payer: Self-pay | Admitting: Physician Assistant

## 2013-05-24 ENCOUNTER — Ambulatory Visit (HOSPITAL_BASED_OUTPATIENT_CLINIC_OR_DEPARTMENT_OTHER): Payer: 59 | Admitting: Physician Assistant

## 2013-05-24 ENCOUNTER — Other Ambulatory Visit (HOSPITAL_BASED_OUTPATIENT_CLINIC_OR_DEPARTMENT_OTHER): Payer: 59 | Admitting: Lab

## 2013-05-24 ENCOUNTER — Encounter (INDEPENDENT_AMBULATORY_CARE_PROVIDER_SITE_OTHER): Payer: Self-pay

## 2013-05-24 ENCOUNTER — Ambulatory Visit (HOSPITAL_BASED_OUTPATIENT_CLINIC_OR_DEPARTMENT_OTHER): Payer: 59

## 2013-05-24 VITALS — BP 131/88 | HR 75 | Temp 97.2°F | Ht 74.0 in | Wt 258.6 lb

## 2013-05-24 DIAGNOSIS — F17200 Nicotine dependence, unspecified, uncomplicated: Secondary | ICD-10-CM

## 2013-05-24 DIAGNOSIS — D751 Secondary polycythemia: Secondary | ICD-10-CM

## 2013-05-24 LAB — CBC WITH DIFFERENTIAL/PLATELET
BASO%: 2.6 % — ABNORMAL HIGH (ref 0.0–2.0)
Eosinophils Absolute: 0.3 10*3/uL (ref 0.0–0.5)
LYMPH%: 32 % (ref 14.0–49.0)
MCHC: 31.7 g/dL — ABNORMAL LOW (ref 32.0–36.0)
MCV: 76.7 fL — ABNORMAL LOW (ref 79.3–98.0)
MONO#: 0.4 10*3/uL (ref 0.1–0.9)
MONO%: 5.9 % (ref 0.0–14.0)
NEUT#: 3.3 10*3/uL (ref 1.5–6.5)
Platelets: 172 10*3/uL (ref 140–400)
RBC: 6.76 10*6/uL — ABNORMAL HIGH (ref 4.20–5.82)
RDW: 17.7 % — ABNORMAL HIGH (ref 11.0–14.6)
WBC: 6.1 10*3/uL (ref 4.0–10.3)

## 2013-05-24 LAB — COMPREHENSIVE METABOLIC PANEL (CC13)
ALT: 25 U/L (ref 0–55)
AST: 21 U/L (ref 5–34)
Albumin: 3.6 g/dL (ref 3.5–5.0)
Alkaline Phosphatase: 59 U/L (ref 40–150)
Glucose: 119 mg/dl (ref 70–140)
Potassium: 4.4 mEq/L (ref 3.5–5.1)
Sodium: 141 mEq/L (ref 136–145)
Total Bilirubin: 0.41 mg/dL (ref 0.20–1.20)
Total Protein: 7.1 g/dL (ref 6.4–8.3)

## 2013-05-24 MED ORDER — LOSARTAN POTASSIUM 50 MG PO TABS: 50.0000 mg | ORAL_TABLET | Freq: Every day | ORAL | Status: DC

## 2013-05-24 NOTE — Patient Instructions (Signed)

## 2013-05-24 NOTE — Patient Instructions (Addendum)
Follow up in 3 months Discontinue smoking

## 2013-05-24 NOTE — Progress Notes (Signed)
Phlebotomy performed via right antecubital vein.  500 cc blood removed. Patient tolerated procedure well. Patient did not stay for post observation period.

## 2013-05-24 NOTE — Progress Notes (Addendum)
[facility] [phone]   [phone]  SHARED VISIT PROGRESS NOTE  Quentin Mulling, MD [address] [place] [address]  DIAGNOSIS: Reactive polycythemia   CURRENT THERAPY: Phlebotomy on an as-needed basis in order to keep the hematocrit around 45%  INTERVAL HISTORY: Jesse Anderson 62 y.o. male returns to the clinic today for three-month followup visit. He reports a recent upper respiratory infection, not associated with fever or chills. He states he only has rare headache. He is scheduled to go to the [facility] for bilateral hearing aides. He will also check on his blood pressure medication refill. He is currently out of his blood pressure medication. He is not sure whether he will need to get a prescription when he presents to the [place] on Friday are whether there will be process and more weak time. He probably reports that he is down to only 8-10 cigarettes per day. This is a significant decrease from 1-1/2 packs of cigarettes daily. He voiced no other specific complaints today.  He denied having any significant weight loss or night sweats. He denied having current headache,  pain or vision changes. The patient denied having any chest pain, shortness breath, cough or hemoptysis. He has repeat CBC performed earlier today and he is here for evaluation and discussion of his lab results.  MEDICAL HISTORY: Past Medical History  Diagnosis Date  . Hypertension   . 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 Degenerative disk disease   . Hemorrhoids   . Polycythemia 05/24/2011    ALLERGIES:  has No Known Allergies.  MEDICATIONS:  Current Outpatient Prescriptions  Medication Sig Dispense Refill  . aspirin 325 MG tablet Take 325 mg by mouth daily.        Marland Kitchen HYDROcodone-acetaminophen (VICODIN) 5-500 MG per tablet Take 1 tablet by mouth every 6 (six) hours as needed.        Marland Kitchen losartan (COZAAR) 50 MG tablet Take 1 tablet (50 mg total) by mouth daily.  30 tablet  0   . mometasone (ELOCON) 0.1 % cream        No current facility-administered medications for this visit.    SURGICAL HISTORY:  Past Surgical History  Procedure Laterality Date  . Tonsillectomy    . Vasectomy    . Hernia repair      REVIEW OF SYSTEMS:  A comprehensive review of systems was negative except for: Ears, nose, mouth, throat, and face: positive for nasal congestion and sore throat   PHYSICAL EXAMINATION: General appearance: alert, cooperative and no distress Head: Normocephalic, without obvious abnormality, atraumatic Neck: no adenopathy Lymph nodes: Cervical, supraclavicular, and axillary nodes normal. Resp: clear to auscultation bilaterally Cardio: regular rate and rhythm, S1, S2 normal, no murmur, click, rub or gallop GI: soft, non-tender; bowel sounds normal; no masses,  no organomegaly Extremities: extremities normal, atraumatic, no cyanosis or edema  ECOG PERFORMANCE STATUS: 0 - Asymptomatic  Blood pressure 131/88, pulse 75, temperature 97.2 F (36.2 C), temperature source Oral, height 6\' 2"  (1.88 m), weight 258 lb 9.6 oz (117.3 kg).  LABORATORY DATA: Lab Results  Component Value Date   WBC 6.1 05/24/2013   HGB 16.4 05/24/2013   HCT 51.9* 05/24/2013   MCV 76.7* 05/24/2013   PLT 172 05/24/2013      Chemistry      Component Value Date/Time   NA 141 05/24/2013 1019   NA 139 02/27/2012 1128   K 4.4 05/24/2013 1019   K 4.3 02/27/2012 1128  CL 106 02/27/2012 1128   CO2 23 05/24/2013 1019   CO2 26 02/27/2012 1128   BUN 16.6 05/24/2013 1019   BUN 15 02/27/2012 1128   CREATININE 0.9 05/24/2013 1019   CREATININE 0.90 02/27/2012 1128      Component Value Date/Time   CALCIUM 9.3 05/24/2013 1019   CALCIUM 9.1 02/27/2012 1128   ALKPHOS 59 05/24/2013 1019   ALKPHOS 62 02/27/2012 1128   AST 21 05/24/2013 1019   AST 22 02/27/2012 1128   ALT 25 05/24/2013 1019   ALT 30 02/27/2012 1128   BILITOT 0.41 05/24/2013 1019   BILITOT 0.4 02/27/2012 1128        RADIOGRAPHIC STUDIES: No results found.  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 His hemoglobin and hematocrit are elevated today, with a hemoglobin of 16.4 hematocrit 51.9%. Patient was discussed with and also seen by Dr. Arbutus Ped. He will work or her phlebotomy today. The goal is to keep his hematocrit around 45%. We'll plan to have him return in 3 months for another symptom management visit as well as a repeat CBC differential, C. met and LDH as well as repeat phlebotomy if needed. He was appraised for his reduction in his smoking and is encouraged to discontinue smoking altogether. A one-month bridge supply the patient's blood pressure medication was sent to his pharmacy of record via E. scribed.   Laural Benes, Jonatha Gagen E, PA-C   He was advised to call immediately she has any concerning symptoms in the interval. The patient voices understanding of current disease status and treatment options and is in agreement with the current care plan.  All questions were answered. The patient knows to call the clinic with any problems, questions or concerns. We can certainly see the patient much sooner if necessary.  ADDENDUM: Hematology/Oncology Attending: I had the face to face encounter with the patient. I recommended his care plan. The patient has reactive polycythemia and his hemoglobin and hematocrit are elevated today. I recommended for him to proceed with one unit of phlebotomy today. He would come back for follow up visit in 3 months with repeat CBC and phlebotomy as needed. He was advised to call immediately if he has any concerning symptoms in the interval. The patient is working on quitting smoking and I strongly advise him to completely quit cigarette smoking. Lajuana Matte., MD. 05/25/2013

## 2013-05-26 ENCOUNTER — Telehealth: Payer: Self-pay | Admitting: Internal Medicine

## 2013-05-26 NOTE — Telephone Encounter (Signed)
s.w. pt and advised on Feb 9th appt....pt ok and aware

## 2013-06-20 ENCOUNTER — Other Ambulatory Visit: Payer: Self-pay | Admitting: Physician Assistant

## 2013-06-21 ENCOUNTER — Telehealth: Payer: Self-pay | Admitting: *Deleted

## 2013-06-21 NOTE — Telephone Encounter (Signed)
Left VM to inquire if patient has any other options for his BP and script to be managed for him? Did he go to [place] as noted in last visit here or is he still seeing his PCP, Dr. Quintella Reichert?

## 2013-06-22 NOTE — Telephone Encounter (Signed)
Reports he had received a 90 day supply X 1 in mail from [place] and nothing since. Unsure how to work with the [place] pharmacy at this time. Informed him we will approve one refill, but he needs to obtain future refills from the [place] or his PCP. He understands and agrees and appreciates our office being willing to refill this time again.

## 2013-06-22 NOTE — Telephone Encounter (Signed)
Refill X 1 and all future from [place] or his PCP, per Tiana Loft, PA

## 2013-08-23 ENCOUNTER — Encounter: Payer: Self-pay | Admitting: Physician Assistant

## 2013-08-23 ENCOUNTER — Telehealth: Payer: Self-pay | Admitting: *Deleted

## 2013-08-23 ENCOUNTER — Ambulatory Visit (HOSPITAL_BASED_OUTPATIENT_CLINIC_OR_DEPARTMENT_OTHER): Payer: 59 | Admitting: Physician Assistant

## 2013-08-23 ENCOUNTER — Other Ambulatory Visit (HOSPITAL_BASED_OUTPATIENT_CLINIC_OR_DEPARTMENT_OTHER): Payer: 59

## 2013-08-23 ENCOUNTER — Ambulatory Visit: Payer: 59

## 2013-08-23 VITALS — BP 120/71 | HR 60 | Temp 98.4°F | Resp 20 | Ht 74.0 in | Wt 259.4 lb

## 2013-08-23 DIAGNOSIS — D751 Secondary polycythemia: Secondary | ICD-10-CM

## 2013-08-23 DIAGNOSIS — D45 Polycythemia vera: Secondary | ICD-10-CM

## 2013-08-23 DIAGNOSIS — Z87891 Personal history of nicotine dependence: Secondary | ICD-10-CM

## 2013-08-23 LAB — CBC WITH DIFFERENTIAL/PLATELET
BASO%: 1.2 % (ref 0.0–2.0)
BASOS ABS: 0.1 10*3/uL (ref 0.0–0.1)
EOS ABS: 0.3 10*3/uL (ref 0.0–0.5)
EOS%: 3.9 % (ref 0.0–7.0)
HCT: 52.9 % — ABNORMAL HIGH (ref 38.4–49.9)
HEMOGLOBIN: 16.9 g/dL (ref 13.0–17.1)
LYMPH#: 2.2 10*3/uL (ref 0.9–3.3)
LYMPH%: 31.7 % (ref 14.0–49.0)
MCH: 25.1 pg — ABNORMAL LOW (ref 27.2–33.4)
MCHC: 31.9 g/dL — ABNORMAL LOW (ref 32.0–36.0)
MCV: 78.7 fL — ABNORMAL LOW (ref 79.3–98.0)
MONO#: 0.5 10*3/uL (ref 0.1–0.9)
MONO%: 6.9 % (ref 0.0–14.0)
NEUT%: 56.3 % (ref 39.0–75.0)
NEUTROS ABS: 3.9 10*3/uL (ref 1.5–6.5)
Platelets: 136 10*3/uL — ABNORMAL LOW (ref 140–400)
RBC: 6.72 10*6/uL — ABNORMAL HIGH (ref 4.20–5.82)
RDW: 17.5 % — AB (ref 11.0–14.6)
WBC: 6.8 10*3/uL (ref 4.0–10.3)

## 2013-08-23 LAB — COMPREHENSIVE METABOLIC PANEL (CC13)
ALBUMIN: 3.9 g/dL (ref 3.5–5.0)
ALT: 38 U/L (ref 0–55)
AST: 27 U/L (ref 5–34)
Alkaline Phosphatase: 61 U/L (ref 40–150)
Anion Gap: 9 mEq/L (ref 3–11)
BUN: 16.1 mg/dL (ref 7.0–26.0)
CALCIUM: 9.4 mg/dL (ref 8.4–10.4)
CHLORIDE: 110 meq/L — AB (ref 98–109)
CO2: 24 mEq/L (ref 22–29)
Creatinine: 0.9 mg/dL (ref 0.7–1.3)
GLUCOSE: 103 mg/dL (ref 70–140)
POTASSIUM: 4.4 meq/L (ref 3.5–5.1)
Sodium: 143 mEq/L (ref 136–145)
TOTAL PROTEIN: 7.1 g/dL (ref 6.4–8.3)
Total Bilirubin: 0.47 mg/dL (ref 0.20–1.20)

## 2013-08-23 LAB — LACTATE DEHYDROGENASE (CC13): LDH: 168 U/L (ref 125–245)

## 2013-08-23 NOTE — Telephone Encounter (Signed)
sw pt and hes going to rs his phel for today. i emailed MW to rs his phel. Pt is aware that we will call w/ appt d/t for his phel d/t...td

## 2013-08-23 NOTE — Progress Notes (Signed)
Patient stuck twice for phlebotomy with 2 unsuccessful attempts. Patient states he wants to be rescheduled.

## 2013-08-23 NOTE — Progress Notes (Signed)
[facility] [phone]   Fax:([phone]  SHARED VISIT PROGRESS NOTE  Garnette Czech, MD [place] [address]  DIAGNOSIS: Reactive polycythemia   CURRENT THERAPY: Phlebotomy on an as-needed basis in order to keep the hematocrit around 45%  INTERVAL HISTORY: Jesse Anderson 63 y.o. male returns to the clinic today for three-month followup visit. He is feeling well today. He reports that he quit smoking 29 days ago. The first improvement that he noticed was that he no longer coughs when he wakes up in the morning. He states that he is scheduled to have colonoscopy through the [place] however this needs to be rescheduled to do to a longer prepped time preprocedure. He reports in addition to quitting smoking he is watching his diet and trying to get some exercise and just be generally healthy year. He voiced no other specific complaints today.  He denied having any significant weight loss or night sweats. He denied having current headache,  pain or vision changes. The patient denied having any chest pain, shortness breath, cough or hemoptysis. He had a repeat CBC performed earlier today and he is here for evaluation and discussion of his lab results.  MEDICAL HISTORY: Past Medical History  Diagnosis Date  . Hypertension   . 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 Degenerative disk disease   . Hemorrhoids   . Polycythemia 05/24/2011    ALLERGIES:  has No Known Allergies.  MEDICATIONS:  Current Outpatient Prescriptions  Medication Sig Dispense Refill  . aspirin 325 MG tablet Take 325 mg by mouth daily.        Marland Kitchen HYDROcodone-acetaminophen (VICODIN) 5-500 MG per tablet Take 1 tablet by mouth every 6 (six) hours as needed.        Marland Kitchen losartan (COZAAR) 50 MG tablet TAKE 1 TABLET (50 MG TOTAL) BY MOUTH DAILY.  30 tablet  0  . mometasone (ELOCON) 0.1 % cream        No current facility-administered medications for this visit.    SURGICAL HISTORY:  Past  Surgical History  Procedure Laterality Date  . Tonsillectomy    . Vasectomy    . Hernia repair      REVIEW OF SYSTEMS:  A comprehensive review of systems was negative.   PHYSICAL EXAMINATION: General appearance: alert, cooperative and no distress Head: Normocephalic, without obvious abnormality, atraumatic Neck: no adenopathy Lymph nodes: Cervical, supraclavicular, and axillary nodes normal. Resp: clear to auscultation bilaterally Cardio: regular rate and rhythm, S1, S2 normal, no murmur, click, rub or gallop GI: soft, non-tender; bowel sounds normal; no masses,  no organomegaly Extremities: extremities normal, atraumatic, no cyanosis or edema  ECOG PERFORMANCE STATUS: 0 - Asymptomatic  Blood pressure 120/71, pulse 60, temperature 98.4 F (36.9 C), temperature source Oral, resp. rate 20, height 6' 2"  (1.88 m), weight 259 lb 6.4 oz (117.663 kg), SpO2 98.00%.  LABORATORY DATA: Lab Results  Component Value Date   WBC 6.8 08/23/2013   HGB 16.9 08/23/2013   HCT 52.9* 08/23/2013   MCV 78.7* 08/23/2013   PLT 136* 08/23/2013      Chemistry      Component Value Date/Time   NA 143 08/23/2013 1006   NA 139 02/27/2012 1128   K 4.4 08/23/2013 1006   K 4.3 02/27/2012 1128   CL 106 02/27/2012 1128   CO2 24 08/23/2013 1006   CO2 26 02/27/2012 1128   BUN 16.1 08/23/2013 1006   BUN 15 02/27/2012 1128  CREATININE 0.9 08/23/2013 1006   CREATININE 0.90 02/27/2012 1128      Component Value Date/Time   CALCIUM 9.4 08/23/2013 1006   CALCIUM 9.1 02/27/2012 1128   ALKPHOS 61 08/23/2013 1006   ALKPHOS 62 02/27/2012 1128   AST 27 08/23/2013 1006   AST 22 02/27/2012 1128   ALT 38 08/23/2013 1006   ALT 30 02/27/2012 1128   BILITOT 0.47 08/23/2013 1006   BILITOT 0.4 02/27/2012 1128       RADIOGRAPHIC STUDIES: No results found.  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  Patient was congratulated on his smoking cessation. His hemoglobin is elevated today at 16.9 g/dL with hematocrit is 52.9%. He will require a unit of phlebotomy today.  Patient was discussed with and also seen by Dr. Julien Nordmann. He will work or her phlebotomy today. The goal is to keep his hematocrit around 45%. We'll plan to have him return in 3 months for another symptom management visit as well as a repeat CBC differential, C. met and LDH as well as repeat phlebotomy if needed.   Wynetta Emery, Guilianna Mckoy E, PA-C   He was advised to call immediately she has any concerning symptoms in the interval. The patient voices understanding of current disease status and treatment options and is in agreement with the current care plan.  All questions were answered. The patient knows to call the clinic with any problems, questions or concerns. We can certainly see the patient much sooner if necessary.  ADDENDUM: Hematology/Oncology Attending: I had the face to face encounter with the patient today. I recommended his care plan.  The patient is a very pleasant 63 years old white male with history of reactive polycythemia secondary to long history of smoking. He quit smoking recently but his hematocrit is still elevated today. I recommended for the patient to proceed with phlebotomy today as scheduled. He would come back for follow up visit in 3 months with repeat CBC and phlebotomy as needed. He was advised to call immediately if he has any concerning symptoms in the interval.  Disclaimer: This note was dictated with voice recognition software. Similar sounding words can inadvertently be transcribed and may not be corrected upon review. Eilleen Kempf., MD 08/23/2013

## 2013-08-23 NOTE — Telephone Encounter (Signed)
sw pt gv appt for 11/22/13 w/ labs@ 9:45am, ov@ 10:15am and tx to follow. i emailed MW to add the tx...td

## 2013-08-23 NOTE — Patient Instructions (Signed)
Congratulations on quitting smoking! Follow up in 3 months

## 2013-08-24 ENCOUNTER — Telehealth: Payer: Self-pay | Admitting: *Deleted

## 2013-08-24 ENCOUNTER — Telehealth: Payer: Self-pay | Admitting: Internal Medicine

## 2013-08-24 NOTE — Telephone Encounter (Signed)
Per staff message and POF I have scheduled appts.  JMW  

## 2013-08-24 NOTE — Telephone Encounter (Signed)
Called pt , left message for  this Friday and advised pt to get appt calendar 2015

## 2013-08-26 ENCOUNTER — Telehealth: Payer: Self-pay | Admitting: *Deleted

## 2013-08-26 NOTE — Telephone Encounter (Signed)
sw pt gv appt for phlebotomy 08/27/13@ 4pm. pt is aware...td

## 2013-08-27 ENCOUNTER — Ambulatory Visit (HOSPITAL_BASED_OUTPATIENT_CLINIC_OR_DEPARTMENT_OTHER): Payer: 59

## 2013-08-27 VITALS — BP 109/72 | HR 70 | Temp 97.5°F | Resp 18

## 2013-08-27 DIAGNOSIS — F17200 Nicotine dependence, unspecified, uncomplicated: Secondary | ICD-10-CM

## 2013-08-27 DIAGNOSIS — D751 Secondary polycythemia: Secondary | ICD-10-CM

## 2013-08-27 NOTE — Progress Notes (Signed)
Phlebotomy completed without any difficultly. One stick done in right antecubital. Patient refused to wait for 30 minutes after phlebotomy completed. VS taken after phlebotomy done and charted. Pt discharged home. Cindi Carbon, RN

## 2013-08-27 NOTE — Patient Instructions (Signed)

## 2013-11-22 ENCOUNTER — Other Ambulatory Visit: Payer: 59

## 2013-11-22 ENCOUNTER — Ambulatory Visit: Payer: 59 | Admitting: Internal Medicine

## 2014-03-14 IMAGING — CT CT TEMPORAL BONES W/O CM
3 of 5 series · 15 of 30 positions shown, 17 images · non-contrast
Comparison: 01/14/2011 CT head.

CLINICAL DATA: Chronic otorrhea right ear for past month.  On
antibiotics with decreasing drainage.

CT TEMPORAL BONES WITHOUT CONTRAST
TECHNIQUE: Axial and coronal plane CT imaging of the petrous
temporal bones was performed with thin-collimation image
reconstruction.  No intravenous contrast was administered.
Multiplanar CT image reconstructions were also generated.

[Series 3: ax mag right · axial · 0.23mm/px · z∈[-20,+26]mm · 5 of 221 slices shown, 7 images]
[im 37/221  brain]
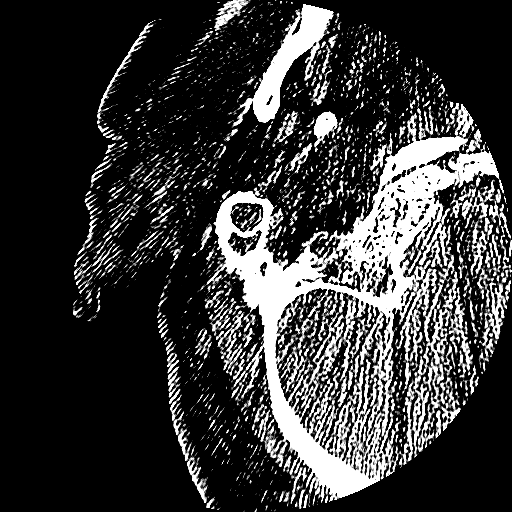
[im 37/221  bone]
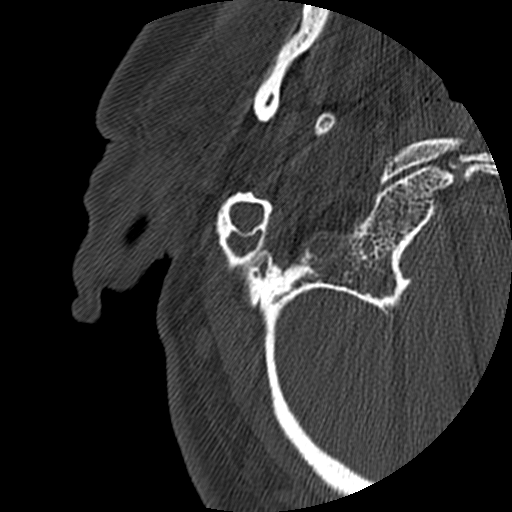
[im 74/221  bone]
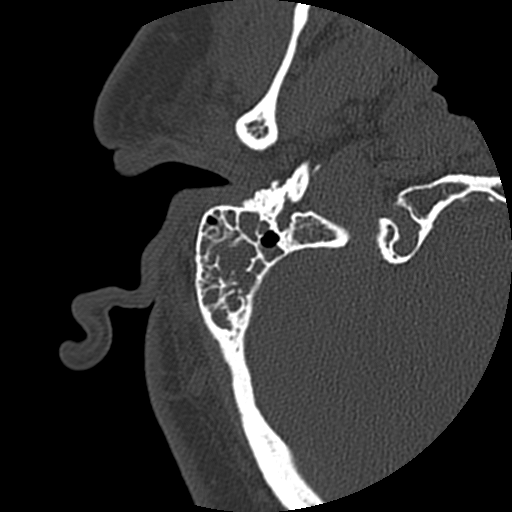
[im 111/221  bone]
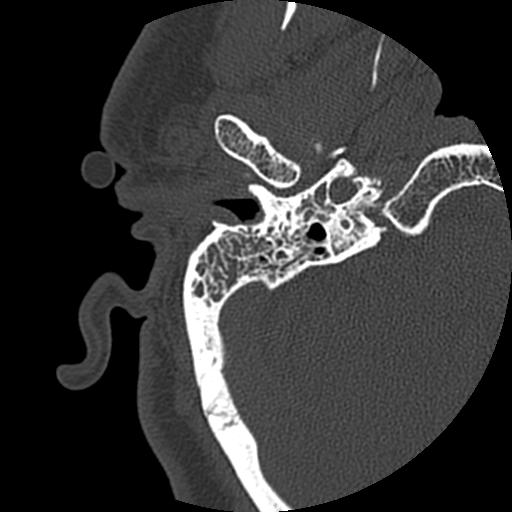
[im 147/221  bone]
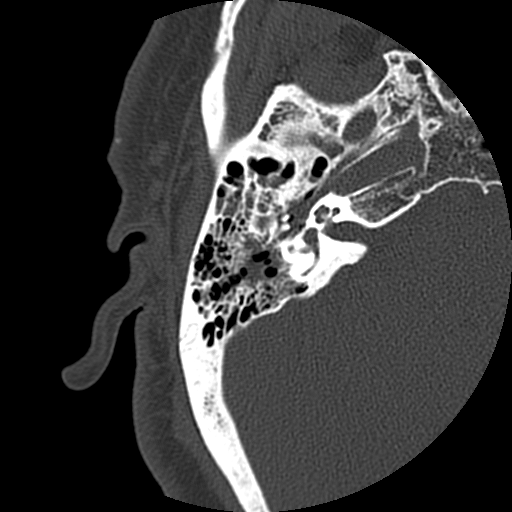
[im 184/221  brain]
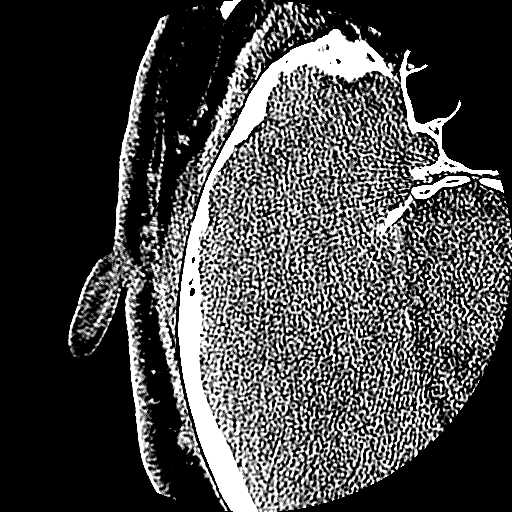
[im 184/221  bone]
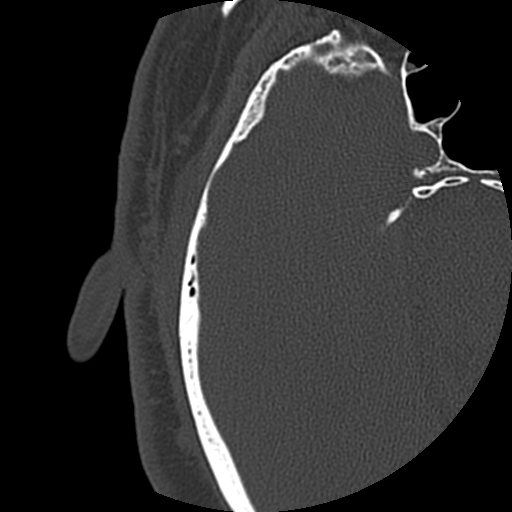

[Series 4: ax mag left · axial · 0.23mm/px · z∈[-18,+23]mm · 4 of 221 slices shown]
[im 45/221  bone]
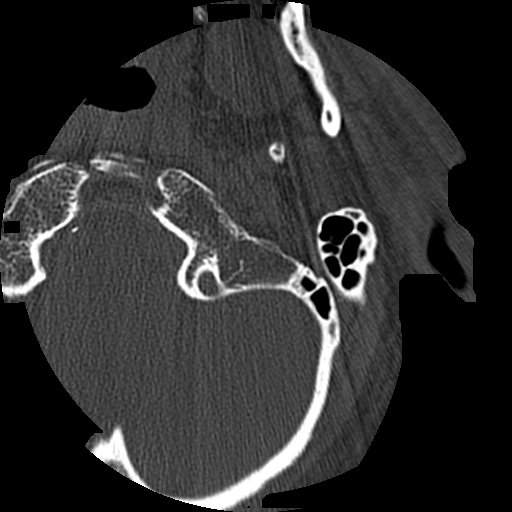
[im 89/221  bone]
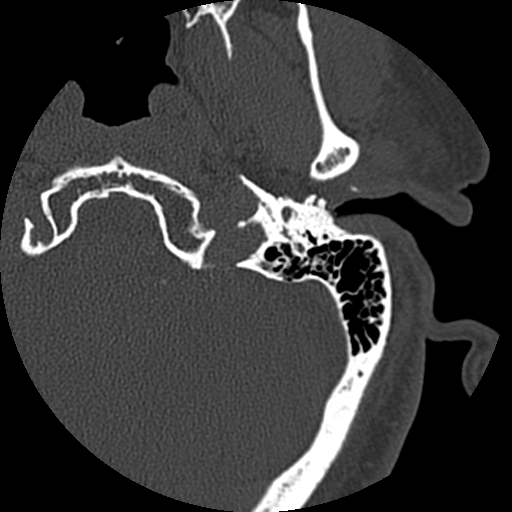
[im 133/221  bone]
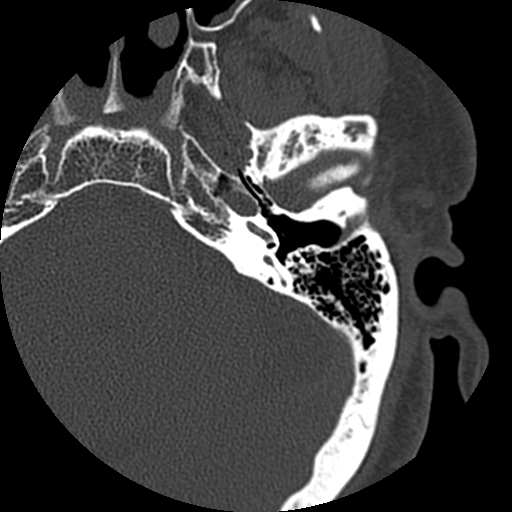
[im 177/221  bone]
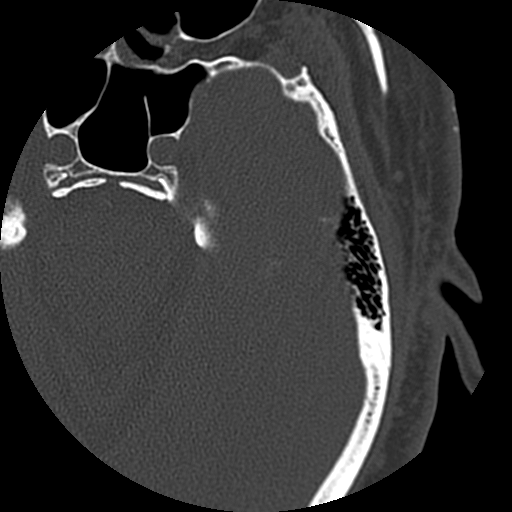

[Series 300: cor mag right · coronal · 0.20mm/px · 6 of 287 slices shown]
[im 41/287  bone]
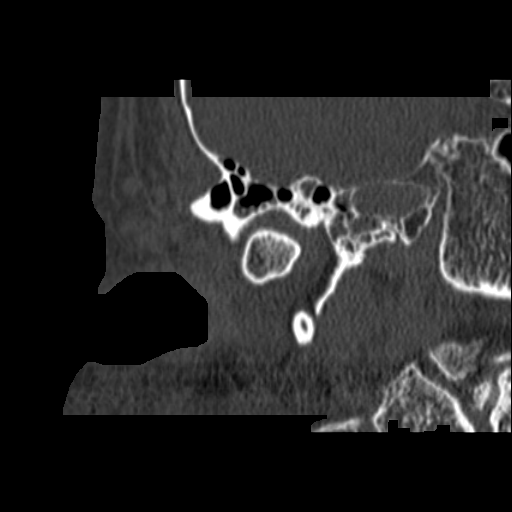
[im 82/287  bone]
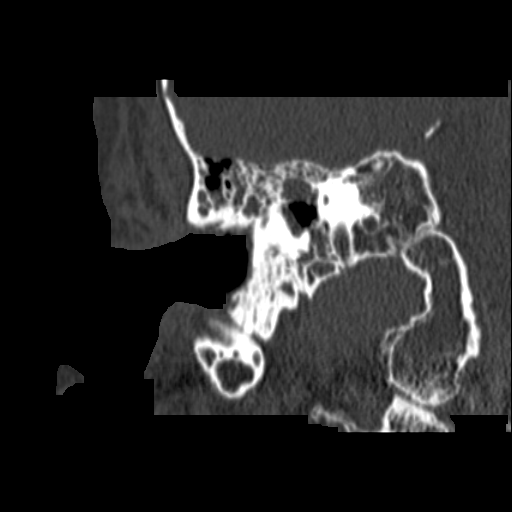
[im 123/287  bone]
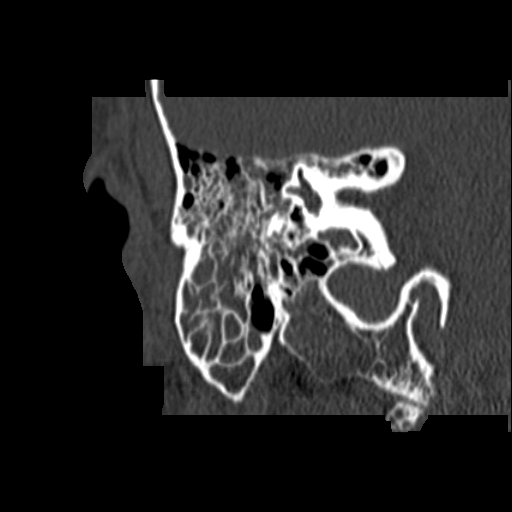
[im 164/287  bone]
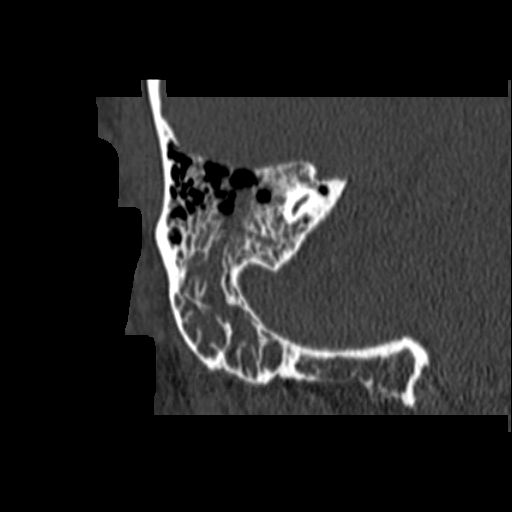
[im 205/287  bone]
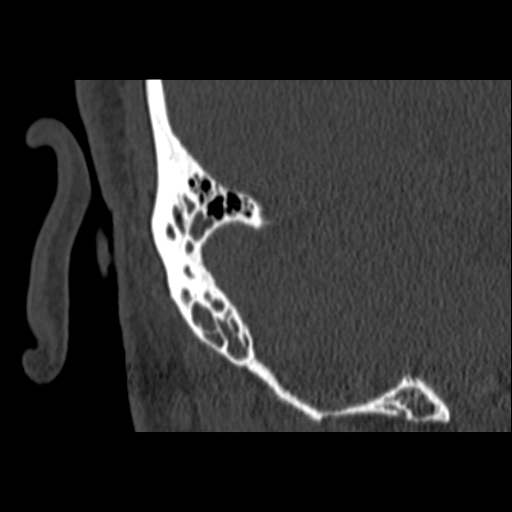
[im 246/287  bone]
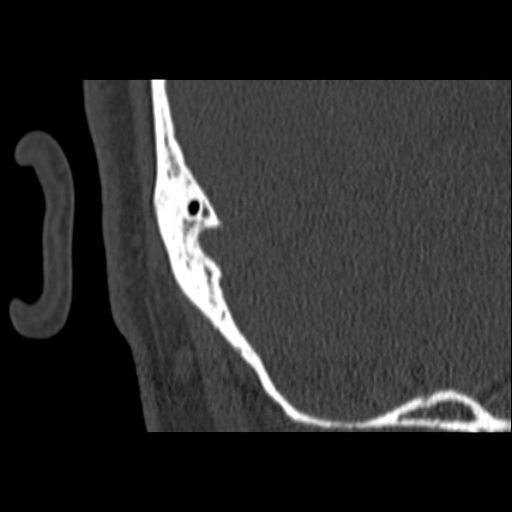

[15 of 30 positions shown; findings below may reference images not displayed]

FINDINGS: Right side:  Opacification right mastoid air cells and middle ear
cavity (epitympanum, mesotypanum and hypotympanum) with
opacification surrounding the ossicles. Majority of the right
ossicles appear to be grossly intact. Slight rarefication of the
stapes but without clear destruction to allow diagnosis of
cholesteatoma.  Scutum appears intact.

The roof of the right mastoid air cells and right middle ear cavity
is thin.  The bony cover of the horizontal segment of the right
seventh cranial nerve root is thin or partially dehiscent.  Right
sigmoid plate appears intact.

Right labyrinthine structures are intact.

Thickening of the periphery of the right external auditory canal
suggestive of spread of infection.

Left side:  Left mastoid air cells, middle ear cavity and petrous
apex are clear.

Left labyrinthine structures are within normal limits.

Intact left sided ossicles and scutum.

Mucosal thickening maxillary sinuses greater on the left.  Adjacent
dental disease upper molars.  Mild mucosal thickening ethmoid sinus
air cells.
IMPRESSION: Opacification right mastoid air cells and middle ear cavity
(epitympanum, mesotypanum and hypotympanum) with opacification
surrounding the ossicles. Majority of the right ossicles appear to
be grossly intact. Slight rarefication of the stapes but without
clear destruction to allow diagnosis of cholesteatoma (early
cholesteatoma type changes cannot be completely excluded).  Scutum
appears intact.

The roof of the right mastoid air cells and right middle ear cavity
is thin.  The bony cover of the horizontal segment of the right
seventh cranial nerve root is thin or partially dehiscent.  Right
sigmoid plate appears intact.

Thickening of the periphery of the right external auditory canal
suggestive of spread of infection.

Left mastoid air cells, middle ear cavity and petrous apex are
clear.

Mucosal thickening maxillary sinuses greater on the left.  Adjacent
dental disease upper molars.  Mild mucosal thickening ethmoid sinus
air cells.
